# Patient Record
Sex: Female | Born: 1985 | Race: Black or African American | Hispanic: No | Marital: Married | Smoking: Never smoker
Health system: Southern US, Community
[De-identification: ages and names within clinical notes are randomized; demographics above are authoritative.]

## PROBLEM LIST (undated history)

## (undated) ENCOUNTER — Inpatient Hospital Stay (HOSPITAL_COMMUNITY): Payer: Self-pay

## (undated) DIAGNOSIS — Z8759 Personal history of other complications of pregnancy, childbirth and the puerperium: Secondary | ICD-10-CM

## (undated) DIAGNOSIS — Z8619 Personal history of other infectious and parasitic diseases: Secondary | ICD-10-CM

## (undated) DIAGNOSIS — E785 Hyperlipidemia, unspecified: Secondary | ICD-10-CM

## (undated) DIAGNOSIS — D649 Anemia, unspecified: Secondary | ICD-10-CM

## (undated) DIAGNOSIS — E7800 Pure hypercholesterolemia, unspecified: Secondary | ICD-10-CM

## (undated) DIAGNOSIS — Z5189 Encounter for other specified aftercare: Secondary | ICD-10-CM

## (undated) DIAGNOSIS — D219 Benign neoplasm of connective and other soft tissue, unspecified: Secondary | ICD-10-CM

## (undated) DIAGNOSIS — O4590 Premature separation of placenta, unspecified, unspecified trimester: Secondary | ICD-10-CM

## (undated) DIAGNOSIS — L309 Dermatitis, unspecified: Secondary | ICD-10-CM

## (undated) HISTORY — DX: Anemia, unspecified: D64.9

---

## 2011-11-04 ENCOUNTER — Ambulatory Visit (INDEPENDENT_AMBULATORY_CARE_PROVIDER_SITE_OTHER): Payer: Managed Care, Other (non HMO) | Admitting: Family Medicine

## 2011-11-04 ENCOUNTER — Ambulatory Visit (HOSPITAL_COMMUNITY)
Admission: RE | Admit: 2011-11-04 | Discharge: 2011-11-04 | Disposition: A | Discharge status: Home / Self Care | Payer: Managed Care, Other (non HMO) | Source: Ambulatory Visit | Attending: Family Medicine | Admitting: Family Medicine

## 2011-11-04 VITALS — BP 98/61 | HR 66 | Temp 98.1°F | Resp 16 | Ht 70.0 in | Wt 119.0 lb

## 2011-11-04 DIAGNOSIS — R109 Unspecified abdominal pain: Secondary | ICD-10-CM

## 2011-11-04 DIAGNOSIS — R110 Nausea: Secondary | ICD-10-CM | POA: Insufficient documentation

## 2011-11-04 DIAGNOSIS — R1031 Right lower quadrant pain: Secondary | ICD-10-CM | POA: Insufficient documentation

## 2011-11-04 DIAGNOSIS — R42 Dizziness and giddiness: Secondary | ICD-10-CM | POA: Insufficient documentation

## 2011-11-04 DIAGNOSIS — R1900 Intra-abdominal and pelvic swelling, mass and lump, unspecified site: Secondary | ICD-10-CM | POA: Insufficient documentation

## 2011-11-04 LAB — POCT CBC
Granulocyte percent: 57.8 %G (ref 37–80)
MID (cbc): 0.8 (ref 0–0.9)
MPV: 7.5 fL (ref 0–99.8)
POC Granulocyte: 5.1 (ref 2–6.9)
POC LYMPH PERCENT: 33.6 %L (ref 10–50)
Platelet Count, POC: 478 10*3/uL — AB (ref 142–424)
RBC: 4.95 M/uL (ref 4.04–5.48)
RDW, POC: 16 %

## 2011-11-04 NOTE — Progress Notes (Signed)
  Subjective:    Patient ID: April Lyons, female    DOB: 1985/12/28, 28 y.o.   MRN: [id number]  HPI 26 year old female with no significant past medical history coming in with a abdominal pain. Patient states this started approximately 2-3 days ago, gradual onset. Patient states that the pain is somewhat dispersed in her whole stomach area. Patient denies any relation to food. Patient states that she does not have much of an appetite. Patient denies any fevers but has had some subjective chills. Patient says that the most pain seems to be now in the right lower quadrant. Patient does have a history of urinary tract infections previously but this does not feel like that. Patient denies any dysuria or back pain. Patient denies any nausea vomiting diarrhea or constipation.   Review of Systems As stated above    Objective:   Physical Exam Filed Vitals:   11/04/11 2108  BP: 98/61  Pulse: 66  Temp: 98.1 F (36.7 C)  Resp: 16   General: No apparent distress alert and oriented x3 mood normal Cardiovascular: Regular in rhythm no murmur Pulmonary: Clear to auscultation bilaterally Abdominal exam: Bowel sounds positive patient is tender generalized but have significant tenderness in the right lower quadrant with involuntary guarding as well as rebound tenderness. Patient does have a positive McBurney sign. Patient has no suprapubic tenderness. Extremities: Patient is neurovascularly intact in 5 out of 5 strength in all extremity. Neurologic: Cranial nerves II through XII intact    Results for orders placed in visit on 11/04/11  POCT URINE PREGNANCY      Component Value Range   Preg Test, Ur Negative    POCT CBC      Component Value Range   WBC 8.8  4.6 - 10.2 K/uL   Lymph, poc 3.0  0.6 - 3.4   POC LYMPH PERCENT 33.6  10 - 50 %L   MID (cbc) 0.8  0 - 0.9   POC MID % 8.6  0 - 12 %M   POC Granulocyte 5.1  2 - 6.9   Granulocyte percent 57.8  37 - 80 %G   RBC 4.95  4.04 - 5.48 M/uL   Hemoglobin 12.8  12.2 - 16.2 g/dL   HCT, POC 40.9  81.1 - 47.9 %   MCV 83.6  80 - 97 fL   MCH, POC 25.9 (*) 27 - 31.2 pg   MCHC 30.9 (*) 31.8 - 35.4 g/dL   RDW, POC 91.4     Platelet Count, POC 478 (*) 142 - 424 K/uL   MPV 7.5  0 - 99.8 fL    Assessment & Plan:   1. Abdominal pain  POCT urine pregnancy, POCT CBC, CT Abdomen Pelvis W Contrast  2. RLQ abdominal pain

## 2011-11-04 NOTE — Assessment & Plan Note (Addendum)
Patient has abdominal pain that has now localized to the right lower quadrant, positive involuntary guarding as well as rebound tenderness. Patient is presenting for potential appendicitis. Urine pregnancy negative patient's white blood cell count is 8.8. Patient does have a little bit of an elevated platelet count that could be in response to inflammation. At this time we will get further imaging which includes a CT scan of the abdomen and pelvis. Patient will be going to [facility] for the skin. Patient knows that she will stay there until the skin is read. Patient understands that if she does have an appendicitis she will likely be having surgery tonight as well.

## 2011-11-05 ENCOUNTER — Other Ambulatory Visit: Payer: Self-pay | Admitting: Family Medicine

## 2011-11-05 ENCOUNTER — Ambulatory Visit (HOSPITAL_COMMUNITY)
Admission: RE | Admit: 2011-11-05 | Discharge: 2011-11-05 | Disposition: A | Discharge status: Home / Self Care | Payer: Managed Care, Other (non HMO) | Source: Ambulatory Visit | Attending: Family Medicine | Admitting: Family Medicine

## 2011-11-05 ENCOUNTER — Ambulatory Visit (INDEPENDENT_AMBULATORY_CARE_PROVIDER_SITE_OTHER): Payer: Managed Care, Other (non HMO) | Admitting: Family Medicine

## 2011-11-05 ENCOUNTER — Encounter (HOSPITAL_COMMUNITY): Payer: Self-pay

## 2011-11-05 VITALS — BP 108/58 | HR 95 | Temp 98.3°F | Resp 16 | Ht 70.0 in | Wt 118.0 lb

## 2011-11-05 DIAGNOSIS — R1031 Right lower quadrant pain: Secondary | ICD-10-CM

## 2011-11-05 DIAGNOSIS — N949 Unspecified condition associated with female genital organs and menstrual cycle: Secondary | ICD-10-CM | POA: Insufficient documentation

## 2011-11-05 DIAGNOSIS — K37 Unspecified appendicitis: Secondary | ICD-10-CM

## 2011-11-05 DIAGNOSIS — D259 Leiomyoma of uterus, unspecified: Secondary | ICD-10-CM

## 2011-11-05 LAB — POCT WET PREP WITH KOH
KOH Prep POC: NEGATIVE
Yeast Wet Prep HPF POC: NEGATIVE

## 2011-11-05 LAB — POCT CBC
HCT, POC: 38.3 % (ref 37.7–47.9)
Hemoglobin: 11.8 g/dL — AB (ref 12.2–16.2)
Lymph, poc: 1.6 (ref 0.6–3.4)
MCHC: 30.8 g/dL — AB (ref 31.8–35.4)
MCV: 83.8 fL (ref 80–97)
POC LYMPH PERCENT: 31.6 %L (ref 10–50)
RDW, POC: 15.6 %

## 2011-11-05 LAB — POCT URINALYSIS DIPSTICK
Nitrite, UA: NEGATIVE
Spec Grav, UA: >=1.03
Urobilinogen, UA: 2

## 2011-11-05 MED ORDER — IOHEXOL 300 MG/ML  SOLN
80.0000 mL | Freq: Once | INTRAMUSCULAR | Status: AC | PRN
  Administered 2011-11-05: 80 mL via INTRAVENOUS

## 2011-11-05 NOTE — Patient Instructions (Addendum)
1. Abdominal pain, RLQ  POCT CBC, POCT Wet Prep with KOH, POCT urinalysis dipstick, GC/chlamydia probe amp, genital, Ambulatory referral to Obstetrics / Gynecology  2. Uterine fibroid  Ambulatory referral to Obstetrics / Gynecology

## 2011-11-05 NOTE — Progress Notes (Signed)
Subjective:    Patient ID: April Lyons, female    DOB: [birth year], 26 y.o.   MRN: [id number]  HPI This 26 y.o. female presents for evaluation of abdominal pain RLQ.  24 hour follow-up for RLQ pain; no change from yesterday; possibly mildly improved.  Pain 4-5/10.    No fever/chills/sweats.  +dizziness persistent from yesterday.  No worsening pain.  Able to sleep for several hours this morning.  Ate a biscuit and cereal today.  No nausea but appetite still down.  Established with gyn while engaged in 12/2010; s/p pap smear normal.    Started OCP 05/2011. First sexual activity after marriage in 08/2011.   No vaginal discharge; no vaginal pain.  No dysuria, urgency, frequency, hematuria.  S/p CT abd/pelvis last night negative for acute appendicitis but + uterine mass.  S/p pelvic ultrasound +7.0 cm fibroid.  No longer having any LLQ pain.  Does have some pain with deep penetration during intercourse.  No history of STDs.  +diarrhea x 2 today; non-bloody.      Review of Systems  Constitutional: Negative for fever, chills and fatigue.  Gastrointestinal: Positive for abdominal pain and diarrhea. Negative for nausea, vomiting, constipation, blood in stool, abdominal distention and rectal pain.  Genitourinary: Positive for pelvic pain and dyspareunia. Negative for dysuria, urgency, frequency, hematuria, flank pain, vaginal bleeding, vaginal discharge, genital sores, vaginal pain and menstrual problem.  Skin: Negative for rash.    No past medical history on file.  No past surgical history on file.  Prior to Admission medications   Medication Sig Start Date End Date Taking? Authorizing Provider  norgestrel-ethinyl estradiol (LO/OVRAL,CRYSELLE) 0.3-30 MG-MCG tablet Take 1 tablet by mouth daily.    Historical Provider, MD    No Known Allergies  History   Social History  . Marital Status: Married    Spouse Name: N/A    Number of Children: N/A  . Years of Education: N/A   Occupational History    . Not on file.   Social History Main Topics  . Smoking status: Never Smoker   . Smokeless tobacco: Not on file  . Alcohol Use: Not on file  . Drug Use: Not on file  . Sexually Active: Not on file   Other Topics Concern  . Not on file   Social History Narrative  . No narrative on file    No family history on file.      Objective:   Physical Exam  Nursing note and vitals reviewed. Constitutional: She is oriented to person, place, and time. She appears well-developed and well-nourished. No distress.  Eyes: Conjunctivae are normal. Pupils are equal, round, and reactive to light.  Neck: Normal range of motion. Neck supple.  Cardiovascular: Normal rate, regular rhythm, normal heart sounds and intact distal pulses.   Pulmonary/Chest: Effort normal and breath sounds normal.  Abdominal: Soft. Bowel sounds are normal. She exhibits no distension and no mass. There is no hepatosplenomegaly. There is tenderness in the suprapubic area. There is guarding. There is no rigidity, no rebound and no CVA tenderness. No hernia.  Genitourinary: There is no rash, tenderness, lesion or injury on the right labia. There is no rash, tenderness, lesion or injury on the left labia. Uterus is enlarged and tender. Cervix exhibits no motion tenderness, no discharge and no friability. Right adnexum displays no mass, no tenderness and no fullness. Left adnexum displays no mass, no tenderness and no fullness. No erythema, tenderness or bleeding around the vagina. No foreign body around  the vagina. No signs of injury around the vagina.       No cervical motion tenderness; +TTP uterus which is enlarged at 8 cm.  Neurological: She is alert and oriented to person, place, and time.  Skin: Skin is warm and dry. She is not diaphoretic.  Psychiatric: She has a normal mood and affect. Her behavior is normal. Judgment and thought content normal.      Results for orders placed in visit on 11/05/11  POCT CBC      Component  Value Range   WBC 5.0  4.6 - 10.2 K/uL   Lymph, poc 1.6  0.6 - 3.4   POC LYMPH PERCENT 31.6  10 - 50 %L   MID (cbc) 0.4  0 - 0.9   POC MID % 8.4  0 - 12 %M   POC Granulocyte 3.0  2 - 6.9   Granulocyte percent 60.0  37 - 80 %G   RBC 4.57  4.04 - 5.48 M/uL   Hemoglobin 11.8 (*) 12.2 - 16.2 g/dL   HCT, POC 54.0  98.1 - 47.9 %   MCV 83.8  80 - 97 fL   MCH, POC 25.8 (*) 27 - 31.2 pg   MCHC 30.8 (*) 31.8 - 35.4 g/dL   RDW, POC 19.1     Platelet Count, POC 431 (*) 142 - 424 K/uL   MPV 7.3  0 - 99.8 fL  POCT WET PREP WITH KOH      Component Value Range   Trichomonas, UA Negative     Clue Cells Wet Prep HPF POC 0-2     Epithelial Wet Prep HPF POC 2-4     Yeast Wet Prep HPF POC neg     Bacteria Wet Prep HPF POC 1+     RBC Wet Prep HPF POC 0-3     WBC Wet Prep HPF POC 15-20     KOH Prep POC Negative    POCT URINALYSIS DIPSTICK      Component Value Range   Color, UA yellow     Clarity, UA clear     Glucose, UA neg     Bilirubin, UA small     Ketones, UA trace     Spec Grav, UA >=1.030     Blood, UA neg     pH, UA 5.5     Protein, UA neg     Urobilinogen, UA 2.0     Nitrite, UA neg     Leukocytes, UA Negative      Assessment & Plan:   1. Abdominal pain, RLQ  POCT CBC, POCT Wet Prep with KOH, POCT urinalysis dipstick, GC/chlamydia probe amp, genital, Ambulatory referral to Obstetrics / Gynecology  2. Uterine fibroid  Ambulatory referral to Obstetrics / Gynecology   3.  Pelvic pain  1.  RLQ Abdominal Pain:  Persistent with slight improvement; s/p CT abd/pelvis negative for acute appendicitis; s/p pelvic ultrasound +fibroid 7cm.  No fever; no vomiting; no worsening pain; continue to observe over next several days.  Recommend OTC Ibuprofen or Tylenol PRN.  RTC for acute worsening.  Obtain wet prep, GC/Chlam.  Diarrhea today likely secondary to contrast with CT last night. 2.  Uterine Fibroid:  New. Detected by CT abd/pelvis and pelvic ultrasound.  Likely etiology to current pain;  refer to gyn for further consultation.  3. Pelvic pain: persistent; secondary to uterine fibroid; obtain GC/Chlam to rule out other contributing etiologies.  Urine pregnancy negative yesterday.

## 2011-11-07 LAB — GC/CHLAMYDIA PROBE AMP, GENITAL
Chlamydia, DNA Probe: NEGATIVE
GC Probe Amp, Genital: NEGATIVE

## 2011-11-12 ENCOUNTER — Telehealth: Payer: Self-pay

## 2011-11-12 NOTE — Telephone Encounter (Signed)
PATIENT WAS SUPPOSED TO BE REFERRED TO AN OBGYN BY DR Katrinka Blazing BUT HAS NOT RECEIVED A CALL FROM HER. SHE IS NEW IN [place]OWN AND IS NOT FAMILIAR WITH ANY OBGYN'S AND WAS HOPING DR Katrinka Blazing COULD MAKE A RECOMMENDATION. SHE SAID ANY OTHER PROVIDER CAN SUGGEST SOMEONE AS WELL.  BEST # [phone]

## 2011-11-25 ENCOUNTER — Encounter: Payer: Managed Care, Other (non HMO) | Admitting: Advanced Practice Midwife

## 2011-12-02 NOTE — Progress Notes (Signed)
History and physical exam reviewed in detail.  Examined patient myself.  S:  Last menses less than one month ago.  No vaginal bleeding or discharge; married with no history of STDs.    O:  General: NAD  Abd:  BSNA; +TTP periumbilical and RLQ with guarding, rebound.  No masses or hernias.  A/P: RLQ pain: New.  Obtain CT abd/pelvis to evaluate for appendicitis.

## 2011-12-04 NOTE — Progress Notes (Signed)
Reviewed and agree.

## 2011-12-04 NOTE — Progress Notes (Signed)
Dr. Katrinka Blazing I was just reviewing the chart and was wondering if you received any feedback from the OB/GYN regarding their evaluation. Thanks

## 2011-12-07 ENCOUNTER — Encounter: Payer: Managed Care, Other (non HMO) | Admitting: Obstetrics & Gynecology

## 2011-12-09 ENCOUNTER — Encounter: Payer: Managed Care, Other (non HMO) | Admitting: Obstetrics & Gynecology

## 2011-12-14 ENCOUNTER — Ambulatory Visit (INDEPENDENT_AMBULATORY_CARE_PROVIDER_SITE_OTHER): Payer: Managed Care, Other (non HMO) | Admitting: Obstetrics & Gynecology

## 2011-12-14 ENCOUNTER — Encounter: Payer: Self-pay | Admitting: Obstetrics & Gynecology

## 2011-12-14 VITALS — BP 105/68 | HR 67 | Temp 97.9°F | Ht 70.0 in | Wt 119.0 lb

## 2011-12-14 DIAGNOSIS — D259 Leiomyoma of uterus, unspecified: Secondary | ICD-10-CM

## 2011-12-14 NOTE — Progress Notes (Signed)
History:  26 y.o. G0 here today for evaluation and management of large fibroid seen on imaging. Reports minimal pain, no bleeding. Was seen in ER for pain, takes NSAIDs occasionally.  The following portions of the patient's history were reviewed and updated as appropriate: allergies, current medications, past family history, past medical history, past social history, past surgical history and problem list.  Review of Systems:  Pertinent items are noted in HPI.  Objective:  Physical Exam Blood pressure 105/68, pulse 67, temperature 97.9 F (36.6 C), temperature source Oral, height 5\' 10"  (1.778 m), weight 119 lb (53.978 kg), last menstrual period 10/31/2011. Gen: NAD Abd: Soft, nontender and nondistended Pelvic: Normal appearing external genitalia; normal appearing vaginal mucosa and cervix.  Normal discharge.  Small uterus, large posterior fibroid palpated, no uterine or adnexal tenderness  Labs and Imaging 11/04/2011 CT ABDOMEN AND PELVIS WITH CONTRAST Findings: Lung bases clear. Liver, spleen, pancreas, kidneys, and adrenal glands normal appearance. Moderate free fluid in pelvis, low attenuation, with additional small amounts of free fluid identified adjacent to the spleen and at the paracolic gutters. Appendix is not well localized.  Questionable visualization of the appendix in the central mid to upper right pelvis on axial and coronal images. Large heterogeneous mass identified cranial to the uterus, 7.3 x 7.4 x 5.5 cm in size. This appears to be contiguous with the superior margin of uterus, question large exophytic uterine leiomyoma. There appear to be normal sized ovaries in the adnexae bilaterally, separate from this mass. Uterus and bladder otherwise normal morphology. Large and small bowel loops normal appearance. No adenopathy or free intraperitoneal air. No acute osseous findings. IMPRESSION: Moderate low attenuation free pelvic fluid with additional free fluid in the pericolic gutters  and adjacent to spleen. A normal appendix is only questionably visualized in the central pelvis; unable to completely exclude acute appendicitis in this setting. Large soft tissue mass 7.3 x 7.4 x 5.5 cm diameter identified cranial to the uterus, question exophytic leiomyoma though other masses are not completely excluded; recommend follow-up pelvic sonography to assess. Original Report Authenticated By: Lollie Marrow, M.D.  11/05/2011 TRANSABDOMINAL AND TRANSVAGINAL ULTRASOUND OF PELVIS Comparison: CT abdomen and pelvis 11/05/2011 Findings: Uterus: 9.0 cm length by 4.8 cm AP by 6.0 cm transverse. Small left lateral leiomyoma posteriorly 2.3 x 1.8 x 2.6 cm. Large exophytic mass at the superior margin of the posterior fundus, 6.9 x 4.6 x 6.3 cm. This mass appears contiguous with the uterus and demonstrates continuity of blood flow on color Doppler imaging, compatible with a large uterine exophytic leiomyoma. Blood flow is present within this exophytic leiomyoma on color Doppler imaging. Endometrium: 3.2 mm thick, normal. No endometrial fluid. Right ovary: 3.0 x 1.5 x 2.1 cm. Normal morphology without mass. Left ovary: 3.1 x 1.7 x 1.5 cm. Normal morphology without mass. Other findings: Moderate amount of simple free pelvic fluid. No additional adnexal masses.  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 This mass demonstrates internal blood flow on color Doppler imaging without evidence of torsion.  Normal appearing ovaries. Moderate simple appearing free pelvic fluid of uncertain etiology. Original Report Authenticated By: Lollie Marrow, M.D.  Assessment & Plan:  Discussed pain management with NSAIDs Discussed Lupron vs. UFE vs myomectomy (open vs robotic). Discussed risks, benefits of all modalities. Patient to consider her options and let us know how she wants to proceed. Pain precautions reviewed

## 2011-12-14 NOTE — Patient Instructions (Signed)
Uterine Fibroid A uterine fibroid is a growth (tumor) that occurs in a woman's uterus. This type of tumor is not cancerous and does not spread out of the uterus. A woman can have one or many fibroids, and the fiboid(s) can become quite large. A fibroid can vary in size, weight, and where it grows in the uterus. Most fibroids do not require medical treatment, but some can cause pain or heavy bleeding during and between periods. CAUSES  A fibroid is the result of a single uterine cell that keeps growing (unregulated), which is different than most cells in the human body. Most cells have a control mechanism that keeps them from reproducing without control.  SYMPTOMS   Bleeding.  Pelvic pain and pressure.  Bladder problems due to the size of the fibroid.  Infertility and miscarriages depending on the size and location of the fibroid. DIAGNOSIS  A diagnosis is made by physical exam. Your caregiver may feel the lumpy tumors during a pelvic exam. Important information regarding size, location, and number of tumors can be gained by having an ultrasound. It is rare that other tests, such as a CT scan or MRI, are needed. TREATMENT   Your caregiver may recommend watchful waiting. This involves getting the fibroid checked by your caregiver to see if the fibroids grow or shrink.   Hormonal treatment or an intrauterine device (IUD) may be prescribed.   Surgery may be needed to remove the fibroids (myomectomy) or the uterus (hysterectomy). This depends on your situation. When fibroids interfere with fertility and a woman wants to become pregnant, a caregiver may recommend having the fibroids removed.  HOME CARE INSTRUCTIONS  Home care depends on how you were treated. In general:   Keep all follow-up appointments with your caregiver.   Only take medicine as told by your caregiver. Do not take aspirin. It can cause bleeding.   If you have excessive periods and soak tampons or pads in a half hour or  less, contact your caregiver immediately. If your periods are troublesome but not so heavy, lie down with your feet raised slightly above your heart. Place cold packs on your lower abdomen.   If your periods are heavy, write down the number of pads or tampons you use per month. Bring this information to your caregiver.   Talk to your caregiver about taking iron pills.   Include green vegetables in your diet.   If you were prescribed a hormonal treatment, take the hormonal medicines as directed.   If you need surgery, ask your caregiver for information on your specific surgery.  SEEK IMMEDIATE MEDICAL CARE IF:  You have pelvic pain or cramps not controlled with medicines.   You have a sudden increase in pelvic pain.   You have an increase of bleeding between and during periods.   You feel lightheaded or have fainting episodes.  MAKE SURE YOU:  Understand these instructions.  Will watch your condition.  Will get help right away if you are not doing well or get worse. Document Released: 02/14/2000 Document Revised: 05/11/2011 Document Reviewed: 03/09/2011 [facility] Patient Information 2013 [place], [place].  Myomectomy Myoma is a non-cancerous tumor made up of fibrous tissue. It is also called leiomyoma, but more often called a fibroid tumor. Myomectomy is the removal of a fibroid tumor without removing another organ, like the uterus or ovary, with it. Fibroids range from the size of a pea to a grapefruit. They are rarely cancerous. Myomas only need treatment when they are growing  or when they cause symptoms, such aspain, pressure, bleeding, and pain with intercourse. LET YOUR CAREGIVER KNOW ABOUT:  Any allergies, especially to medicines.  If you develop a cold or an infection before your surgery.  Medicines taken, including vitamins, herbs, eyedrops, over-ther-counter medicines, and creams.  Use of steroids (by mouth or creams).  Previous problems with numbing  medicines.  History of blood clots or other bleeding problems.  Other health problems, such as diabetes, kidney, heart, or lung problems.  Previous surgery.  Possibility of pregnancy, if this applies. RISKS AND COMPLICATIONS   Excessive bleeding.  Infection.  Injury to other organs.  Blood clots in the legs, chest, and brain.  Scar tissue (adhesions) on other organs and in the pelvis.  Death during or after the surgery. BEFORE THE PROCEDURE  Follow your caregiver's advice regarding your surgery and preparing for surgery.  Avoid taking aspirin or blood thinners as directed by your caregiver.  DO NOT eat or drink anything after midnight on the night before surgery, or as directed by your caregiver.  DO NOT smoke (if you smoke) for 2 weeks before the surgery.  DO NOT drink alcohol the day before the surgery.  If you are admitted the day of the surgery,arrive1 hour before your surgery is scheduled.  Arrange to have someone take you home from the hospital.  Arrange to have someone care for you when you go home. PROCEDURE There are several ways to perform a myomectomy:  Hysteroscopy myomectomy. A lighted tube is inserted inside the uterus. The tube will remove the fibroid. This is used when the fibroid is inside the cavity of the uterus.  Laparoscopic myomectomy. A long, lighted tube is inserted through 2 or 3 small incisions to see the organs in the pelvis. The fibroid is removed.  Myomectomy through a sugical cut (incicion) in the abdomen. The fibroid is removed through an incision made in the stomach. This way is performed when thethe fibroid cannot be removed with a hysteroscope or laprascope. AFTER THE PROCEDURE  If you had laparoscopic or hysteroscopic myomectomy, you may go home the same day or stay overnight.  If you had abdominal myomectomy, you may stay in the hospital a few days.  Your intravenous (IV)access tube and catheter will be removed in 1 or 2  days.  If you stay in the hospital, your caregiver will order pain medicine and a sleeping pill, if needed.  You may be placed on an antibiotic medicine, if needed.  You may be given written instructions and medicines before you are sent home. Document Released: 12/14/2006 Document Revised: 05/11/2011 Document Reviewed: 12/26/2008 [facility] Patient Information 2013 [place], [place].  Uterine Artery Embolization for Fibroids Uterine fibroids are non-cancerous (benign) smooth muscle tumors of the uterus. When they become large, they may produce symptoms of pain and bleeding. Fibroids are sometimes individually removed during surgery or removed with the uterus (hysterectomy).  One non-surgical treatment used to shrink fibroids is called uterine artery embolization. A specialist (interventional radiologist) uses a thin plastic hose(catheter) to inject material that blocks off the blood supply to the fibroid. In time, this causes the fibroid to shrink. PROCEDURE  Under local anesthetic (a medication that numbs part of the body) the radiologist makes a small cut in the groin. A catheter is then inserted into the main artery of the leg. Using fluoroscopy, your radiologist guides the catheter through the artery to the uterus. A series of images are taken while dye is injected. This is done to  provide a road map of the blood supply to the uterus and fibroids. Tiny plastic spheres about the size of sand grains are then injected through the catheter. Metal coils may sometimes also be used to help block the artery. The particles lodge in tiny branches of the uterine artery that supplies blood to the fibroids. The procedure is repeated on the artery that supplies the other side of the uterus. The hospital stay is usually overnight. Normal activity can resume after about a week. Mild pain and cramping following the procedure is easily treated with medication and anti-inflammatory drugs. These usually last only a  couple days.  RISKS AND COMPLICATIONS  Injury to the uterus from decreased blood supply may happen.  This could require removal of the uterus (hysterectomy).  Pain and bleeding can occur.  Infection and abscess (a cyst filled with pus).  A cyst filled with blood (hematoma).  Blood infection (septicemia).  Amenorrhea (no menstrual period).  Dying of tissue cells that cannot recover (necrosis) to the bladder or lips of the vagina.  Fistula (a connection between organs or from organ to the skin).  Blood clot in the lung (pulmonary embolus).  Rarely death. EXPECTED OUTCOME An ultrasound or MRI is done in 6 months to make sure the fibroids have shrunk. The fibroids usually shrink to about half their original size. In most cases these effects are long lasting.   The uterus also shrinks but does not die. You may not be able to get pregnant following this procedure.  It cannot be estimated what the effects of the procedure will be on menses. Usually there is less bleeding.  The procedure may cause premature menopause or loss of menstrual cycle. HOME CARE INSTRUCTIONS   Follow your caregiver's advice regarding medications given to you, diet, activity and when to begin sexual activity.  See your caregiver for follow up care as directed.  Do not take aspirin it can cause bleeding. Only take over-the-counter or prescription medicines for pain, discomfort, or fever as directed by your caregiver.  Care for and change dressing as directed. SEEK MEDICAL CARE IF:   You develop a temperature of 102 F (38.9 C) or higher.  There is redness, swelling and pain around the wound.  You have pus draining from the wound.  You develop a rash. SEEK IMMEDIATE MEDICAL CARE IF:   You have bleeding from the wound.  You have difficulty breathing.  You develop chest pain.  You develop belly (abdominal) pain.  You develop leg pain.  You become dizzy and pass out. Document Released:  05/04/2005 Document Revised: 05/11/2011 Document Reviewed: 03/31/2007 [facility] Patient Information 2013 [place], [place].

## 2013-04-06 LAB — OB RESULTS CONSOLE HIV ANTIBODY (ROUTINE TESTING): HIV: NONREACTIVE

## 2013-04-06 LAB — OB RESULTS CONSOLE GC/CHLAMYDIA
Chlamydia: NEGATIVE
Gonorrhea: NEGATIVE

## 2013-04-06 LAB — OB RESULTS CONSOLE RPR: RPR: NONREACTIVE

## 2013-04-06 LAB — OB RESULTS CONSOLE HEPATITIS B SURFACE ANTIGEN: HEP B S AG: NEGATIVE

## 2013-04-06 LAB — OB RESULTS CONSOLE ANTIBODY SCREEN: Antibody Screen: NEGATIVE

## 2013-04-06 LAB — OB RESULTS CONSOLE ABO/RH: RH TYPE: POSITIVE

## 2013-04-06 LAB — OB RESULTS CONSOLE RUBELLA ANTIBODY, IGM: Rubella: IMMUNE

## 2013-08-28 ENCOUNTER — Observation Stay (HOSPITAL_COMMUNITY): Payer: Managed Care, Other (non HMO)

## 2013-08-28 ENCOUNTER — Observation Stay (HOSPITAL_COMMUNITY)
Admission: AD | Admit: 2013-08-28 | Discharge: 2013-08-30 | Disposition: A | Discharge status: Home / Self Care | Payer: Managed Care, Other (non HMO) | Source: Ambulatory Visit | Attending: Obstetrics and Gynecology | Admitting: Obstetrics and Gynecology

## 2013-08-28 ENCOUNTER — Encounter (HOSPITAL_COMMUNITY): Payer: Self-pay | Admitting: *Deleted

## 2013-08-28 DIAGNOSIS — O4700 False labor before 37 completed weeks of gestation, unspecified trimester: Principal | ICD-10-CM | POA: Diagnosis present

## 2013-08-28 HISTORY — DX: Pure hypercholesterolemia, unspecified: E78.00

## 2013-08-28 LAB — URINALYSIS, ROUTINE W REFLEX MICROSCOPIC
Bilirubin Urine: NEGATIVE
GLUCOSE, UA: NEGATIVE mg/dL
HGB URINE DIPSTICK: NEGATIVE
Ketones, ur: NEGATIVE mg/dL
Nitrite: NEGATIVE
PH: 8 (ref 5.0–8.0)
PROTEIN: NEGATIVE mg/dL
Specific Gravity, Urine: 1.02 (ref 1.005–1.030)
Urobilinogen, UA: 1 mg/dL (ref 0.0–1.0)

## 2013-08-28 LAB — COMPREHENSIVE METABOLIC PANEL
ALK PHOS: 96 U/L (ref 39–117)
ALT: 11 U/L (ref 0–35)
AST: 16 U/L (ref 0–37)
Albumin: 2.6 g/dL — ABNORMAL LOW (ref 3.5–5.2)
BILIRUBIN TOTAL: 0.2 mg/dL — AB (ref 0.3–1.2)
BUN: 6 mg/dL (ref 6–23)
CHLORIDE: 97 meq/L (ref 96–112)
CO2: 25 meq/L (ref 19–32)
Calcium: 8.9 mg/dL (ref 8.4–10.5)
Creatinine, Ser: 0.5 mg/dL (ref 0.50–1.10)
GFR calc non Af Amer: >90 mL/min (ref >90)
GLUCOSE: 95 mg/dL (ref 70–99)
POTASSIUM: 4.6 meq/L (ref 3.7–5.3)
SODIUM: 132 meq/L — AB (ref 137–147)
TOTAL PROTEIN: 6.8 g/dL (ref 6.0–8.3)

## 2013-08-28 LAB — URINE MICROSCOPIC-ADD ON

## 2013-08-28 MED ORDER — ACETAMINOPHEN 325 MG PO TABS: 650.0000 mg | ORAL_TABLET | ORAL | Status: DC | PRN

## 2013-08-28 MED ORDER — NIFEDIPINE 10 MG PO CAPS
10.0000 mg | ORAL_CAPSULE | Freq: Four times a day (QID) | ORAL | Status: DC
  Administered 2013-08-29 – 2013-08-30 (×7): 10 mg via ORAL
  Filled 2013-08-28 (×7): qty 1

## 2013-08-28 MED ORDER — PRENATAL MULTIVITAMIN CH
1.0000 | ORAL_TABLET | Freq: Every day | ORAL | Status: DC
  Administered 2013-08-29 – 2013-08-30 (×2): 1 via ORAL
  Filled 2013-08-28 (×2): qty 1

## 2013-08-28 MED ORDER — ZOLPIDEM TARTRATE 5 MG PO TABS: 5.0000 mg | ORAL_TABLET | Freq: Every evening | ORAL | Status: DC | PRN

## 2013-08-28 MED ORDER — BETAMETHASONE SOD PHOS & ACET 6 (3-3) MG/ML IJ SUSP
12.0000 mg | INTRAMUSCULAR | Status: AC
  Administered 2013-08-28 – 2013-08-29 (×2): 12 mg via INTRAMUSCULAR
  Filled 2013-08-28 (×2): qty 2

## 2013-08-28 MED ORDER — DOCUSATE SODIUM 100 MG PO CAPS
100.0000 mg | ORAL_CAPSULE | Freq: Every day | ORAL | Status: DC
  Filled 2013-08-28 (×2): qty 1

## 2013-08-28 MED ORDER — ONDANSETRON 8 MG PO TBDP
8.0000 mg | ORAL_TABLET | Freq: Once | ORAL | Status: AC
  Administered 2013-08-28: 8 mg via ORAL
  Filled 2013-08-28: qty 1

## 2013-08-28 MED ORDER — NIFEDIPINE 10 MG PO CAPS
20.0000 mg | ORAL_CAPSULE | Freq: Once | ORAL | Status: AC
  Administered 2013-08-28: 20 mg via ORAL
  Filled 2013-08-28: qty 2

## 2013-08-28 MED ORDER — CALCIUM CARBONATE ANTACID 500 MG PO CHEW: 2.0000 | CHEWABLE_TABLET | ORAL | Status: DC | PRN

## 2013-08-28 NOTE — Progress Notes (Signed)
Patient has been resting on her side. States she has not felt any "spikes" for awhile and was able to take a nap.

## 2013-08-28 NOTE — H&P (Signed)
April Lyons is a 28 y.o. female presenting for abdominal pain and contractions  [name] G1P0 @ 29+2 presents for abdominal pain and painful contractions. Pt states abdominal pain started around 1:30. She denies nausea, vomiting, fever, chills, vaginal bleeding, or leaking of fluid.  Pain became worse and was 10/10 on admission. She started vomiting on admission. Pain gradually improved but then the baby had a decel to the 50s for 5 minutes. The decision was made to admit the patient for observation and continuous monitoring History OB History   Grav Para Term Preterm Abortions TAB SAB Ect Mult Living   1              Past Medical History  Diagnosis Date  . Anemia   . Hypercholesteremia     Fibroids   History reviewed. No pertinent past surgical history. Family History: family history includes Alcohol abuse in her paternal grandfather; Asthma in her brother; Cancer in her maternal grandfather and maternal grandmother; Hypertension in her mother. Social History:  reports that she has never smoked. She has never used smokeless tobacco. She reports that she does not drink alcohol or use illicit drugs.   Prenatal Transfer Tool  Maternal Diabetes: No Genetic Screening: Declined Maternal Ultrasounds/Referrals: Normal Fetal Ultrasounds or other Referrals:  None Maternal Substance Abuse:  No Significant Maternal Medications:  None Significant Maternal Lab Results:  None Other Comments:  None  ROS: as above  Dilation: Fingertip Effacement (%): Thick Exam by:: April Midget, PA Blood pressure 117/63, pulse 75, temperature 98.6 F (37 C), temperature source Oral, resp. rate 18, height 5\' 10"  (1.778 m), weight 61.689 kg (136 lb). Exam Physical Exam  Prenatal labs: ABO, Rh:  A positive Antibody:  Negative Rubella:   Immune RPR:   NR HBsAg:   Neg HIV:   NR GBS:   Not done  Assessment/Plan: 1) Admit to antenatal for 23 hr obs 2) Continuous monitoring 3) Comprehensive [place] to evaluate  for occult abruption 4) BMZ to enhance FLM 5) Unable to do FFN d/t recent intercourse  Lyons,April H. 08/28/2013, 11:09 PM

## 2013-08-28 NOTE — MAU Provider Note (Signed)
History     CSN: [id number]  Arrival date and time: 08/28/13 1615   First Provider Initiated Contact with Patient 08/28/13 1630      No chief complaint on file.  HPI Ms. April Lyons is a 28 y.o. No obstetric history on file. At Unknown who presents to MAU today with complaint of contractions q 3 minutes since 1420 today. The patient denies vaginal bleeding or LOF today. She states that she noted a yellow mucus discharge. She denies complications with the pregnancy. She reports good fetal movement. Patient denies N/V prior to arrival, but is actively vomiting in MAU just after arrival.   OB History   Grav Para Term Preterm Abortions TAB SAB Ect Mult Living   1               Past Medical History  Diagnosis Date  . Anemia   . Hypercholesteremia     History reviewed. No pertinent past surgical history.  Family History  Problem Relation Age of Onset  . Hypertension Mother   . Asthma Brother   . Cancer Maternal Grandmother     unknown  . Cancer Maternal Grandfather     lung cancer  . Alcohol abuse Paternal Grandfather     History  Substance Use Topics  . Smoking status: Never Smoker   . Smokeless tobacco: Never Used  . Alcohol Use: No    Allergies: No Known Allergies  Prescriptions prior to admission  Medication Sig Dispense Refill  . Prenatal Vit-Fe Fumarate-FA (PRENATAL MULTIVITAMIN) TABS tablet Take 1 tablet by mouth daily at 12 noon.        Review of Systems  Constitutional: Negative for fever.  Gastrointestinal: Positive for abdominal pain.  Genitourinary:       + vaginal discharge Neg - vaginal bleeding, LOF   Physical Exam   Blood pressure 117/66, pulse 81, temperature 97.8 F (36.6 C), temperature source Oral, resp. rate 18.  Physical Exam  Constitutional: She is oriented to person, place, and time. She appears well-developed and well-nourished. No distress.  HENT:  Head: Normocephalic and atraumatic.  Cardiovascular: Normal rate.    Respiratory: Effort normal.  Neurological: She is alert and oriented to person, place, and time.  Skin: Skin is warm and dry. No erythema.  Psychiatric: She has a normal mood and affect.  Dilation: Fingertip Effacement (%): Thick Cervical Position: Posterior Exam by:: Bethena Midget, PA  Results for orders placed during the hospital encounter of 08/28/13 (from the past 24 hour(s))  URINALYSIS, ROUTINE W REFLEX MICROSCOPIC     Status: Abnormal   Collection Time    08/28/13  5:17 PM      Result Value Ref Range   Color, Urine YELLOW  YELLOW   APPearance HAZY (*) CLEAR   Specific Gravity, Urine 1.020  1.005 - 1.030   pH 8.0  5.0 - 8.0   Glucose, UA NEGATIVE  NEGATIVE mg/dL   Hgb urine dipstick NEGATIVE  NEGATIVE   Bilirubin Urine NEGATIVE  NEGATIVE   Ketones, ur NEGATIVE  NEGATIVE mg/dL   Protein, ur NEGATIVE  NEGATIVE mg/dL   Urobilinogen, UA 1.0  0.0 - 1.0 mg/dL   Nitrite NEGATIVE  NEGATIVE   Leukocytes, UA SMALL (*) NEGATIVE  URINE MICROSCOPIC-ADD ON     Status: Abnormal   Collection Time    08/28/13  5:17 PM      Result Value Ref Range   Squamous Epithelial / LPF FEW (*) RARE   WBC, UA 11-20  <3  WBC/hpf   RBC / HPF 3-6  <3 RBC/hpf   Bacteria, UA MANY (*) RARE   Urine-Other AMORPHOUS URATES/PHOSPHATES      Fetal Monitoring:  Baseline: 120 bpm, moderate variability, + accelerations, no decelerations Contractions: Irregular with moderate UI  MAU Course  Procedures None  MDM UA today Urine culture ordered Discussed with Dr. Harrington Challenger. 20 mg Procardia in MAU and then admit to Antenatal for observation  Assessment and Plan  A: [facility] at [redacted]w[redacted]d Preterm contractions  P: Urine culture pending Admit to Antenatal for observation   Farris Has, PA-C  08/28/2013, 7:00 PM

## 2013-08-28 NOTE — MAU Note (Addendum)
Pain since 1440, states is constant across lower abdomen. Then states that pain comes and goes, but does not completely go away. Sometimes "little spikes" other times worse pain. Thinks it is contractions. No bleeding or leaking.

## 2013-08-29 LAB — CBC
HEMATOCRIT: 34.6 % — AB (ref 36.0–46.0)
HEMOGLOBIN: 11.4 g/dL — AB (ref 12.0–15.0)
MCH: 28.4 pg (ref 26.0–34.0)
MCHC: 32.9 g/dL (ref 30.0–36.0)
MCV: 86.1 fL (ref 78.0–100.0)
Platelets: 285 10*3/uL (ref 150–400)
RBC: 4.02 MIL/uL (ref 3.87–5.11)
RDW: 13.6 % (ref 11.5–15.5)
WBC: 11.1 10*3/uL — AB (ref 4.0–10.5)

## 2013-08-29 NOTE — Progress Notes (Signed)
Called by RN to discuss discharge home.  Pt receiving 2nd dose of betamethasone now.  I reviewed available FH tracing for the day and found that pt had an additional 4-5 minute decel to approx 40 at approx 6:15pm.  Advised RN that we would like to keep her an additional night and consider an MFM consultation prior to discharge.  FHT are otherwise very reassuring with moderate variability and + acclerations.

## 2013-08-29 NOTE — Progress Notes (Addendum)
HD#2 admission for 5 minute deceleration in MAU while being evaluated for abdominal pain Pt states pain resolved around midnight and feels good now.  Good FM, no LOF or bleeding. FHT 135 reactive, strip reviewed from overnight and no additional decelerations noted: Cat 1 TOCO: occ irritability SVE: deferred  A/P: FHT deceleration Currently cat 1 [place] 6/29: marginal/low lying placenta, will repeat 28 weeks, no bleeding, no evidence of subchor hem or abruption Received one dose of betamethasone at 9pm, will continue to monitor continously today and get 2nd dose of beta this evening.  If no further deceleration and no new complaint will plan to d/c home.

## 2013-08-30 ENCOUNTER — Observation Stay (HOSPITAL_COMMUNITY): Payer: Managed Care, Other (non HMO)

## 2013-08-30 LAB — CBC
HCT: 33.4 % — ABNORMAL LOW (ref 36.0–46.0)
Hemoglobin: 11.2 g/dL — ABNORMAL LOW (ref 12.0–15.0)
MCH: 28.9 pg (ref 26.0–34.0)
MCHC: 33.5 g/dL (ref 30.0–36.0)
MCV: 86.3 fL (ref 78.0–100.0)
PLATELETS: 263 10*3/uL (ref 150–400)
RBC: 3.87 MIL/uL (ref 3.87–5.11)
RDW: 13.6 % (ref 11.5–15.5)
WBC: 13.2 10*3/uL — AB (ref 4.0–10.5)

## 2013-08-30 MED ORDER — NIFEDIPINE 10 MG PO CAPS: 10.0000 mg | ORAL_CAPSULE | Freq: Four times a day (QID) | ORAL | Status: DC

## 2013-08-30 NOTE — Progress Notes (Signed)
Ur chart review completed.  

## 2013-08-30 NOTE — Discharge Instructions (Signed)
Preterm Labor Information Preterm labor is when labor starts at less than 37 weeks of pregnancy. The normal length of a pregnancy is 39 to 41 weeks. CAUSES Often, there is no identifiable underlying cause as to why a woman goes into preterm labor. One of the most common known causes of preterm labor is infection. Infections of the uterus, cervix, vagina, amniotic sac, bladder, kidney, or even the lungs (pneumonia) can cause labor to start. Other suspected causes of preterm labor include:   Urogenital infections, such as yeast infections and bacterial vaginosis.   Uterine abnormalities (uterine shape, uterine septum, fibroids, or bleeding from the placenta).   A cervix that has been operated on (it may fail to stay closed).   Malformations in the fetus.   Multiple gestations (twins, triplets, and so on).   Breakage of the amniotic sac.  RISK FACTORS  Having a previous history of preterm labor.   Having premature rupture of membranes (PROM).   Having a placenta that covers the opening of the cervix (placenta previa).   Having a placenta that separates from the uterus (placental abruption).   Having a cervix that is too weak to hold the fetus in the uterus (incompetent cervix).   Having too much fluid in the amniotic sac (polyhydramnios).   Taking illegal drugs or smoking while pregnant.   Not gaining enough weight while pregnant.   Being younger than 48 and older than 30 years old.   Having a low socioeconomic status.   Being African American. SYMPTOMS Signs and symptoms of preterm labor include:   Menstrual-like cramps, abdominal pain, or back pain.  Uterine contractions that are regular, as frequent as six in an hour, regardless of their intensity (may be mild or painful).  Contractions that start on the top of the uterus and spread down to the lower abdomen and back.   A sense of increased pelvic pressure.   A watery or bloody mucus discharge that  comes from the vagina.  TREATMENT Depending on the length of the pregnancy and other circumstances, your health care provider may suggest bed rest. If necessary, there are medicines that can be given to stop contractions and to mature the fetal lungs. If labor happens before 34 weeks of pregnancy, a prolonged hospital stay may be recommended. Treatment depends on the condition of both you and the fetus.  WHAT SHOULD YOU DO IF YOU THINK YOU ARE IN PRETERM LABOR? Call your health care provider right away. You will need to go to the hospital to get checked immediately. HOW CAN YOU PREVENT PRETERM LABOR IN FUTURE PREGNANCIES? You should:   Stop smoking if you smoke.  Maintain healthy weight gain and avoid chemicals and drugs that are not necessary.  Be watchful for any type of infection.  Inform your health care provider if you have a known history of preterm labor. Document Released: 05/09/2003 Document Revised: 10/19/2012 Document Reviewed: 03/21/2012 [facility] Patient Information 2015 [place], [place]. This information is not intended to replace advice given to you by your health care provider. Make sure you discuss any questions you have with your health care provider.   Pelvic Rest Pelvic rest is sometimes recommended for women when:   The placenta is partially or completely covering the opening of the cervix (placenta previa).  There is bleeding between the uterine wall and the amniotic sac in the first trimester (subchorionic hemorrhage).  The cervix begins to open without labor starting (incompetent cervix, cervical insufficiency).  The labor is too early (  preterm labor). HOME CARE INSTRUCTIONS  Do not have sexual intercourse, stimulation, or an orgasm.  Do not use tampons, douche, or put anything in the vagina.  Do not lift anything over 10 pounds (4.5 kg).  Avoid strenuous activity or straining your pelvic muscles. SEEK MEDICAL CARE IF:  You have any vaginal bleeding  during pregnancy. Treat this as a potential emergency.  You have cramping pain felt low in the stomach (stronger than menstrual cramps).  You notice vaginal discharge (watery, mucus, or bloody).  You have a low, dull backache.  There are regular contractions or uterine tightening. SEEK IMMEDIATE MEDICAL CARE IF: You have vaginal bleeding and have placenta previa.  Document Released: 06/13/2010 Document Revised: 05/11/2011 Document Reviewed: 06/13/2010 [facility] Patient Information 2015 [place], [place]. This information is not intended to replace advice given to you by your health care provider. Make sure you discuss any questions you have with your health care provider.  Fetal Movement Counts  Patient Name: __________________________________________________ Patient Due Date: ____________________ Performing a fetal movement count is highly recommended in high-risk pregnancies, but it is good for every pregnant woman to do. Your caregiver may ask you to start counting fetal movements at 28 weeks of the pregnancy. Fetal movements often increase:  After eating a full meal.  After physical activity.  After eating or drinking something sweet or cold.  At rest. Pay attention to when you feel the baby is most active. This will help you notice a pattern of your baby's sleep and wake cycles and what factors contribute to an increase in fetal movement. It is important to perform a fetal movement count at the same time each day when your baby is normally most active.  HOW TO COUNT FETAL MOVEMENTS 1. Find a quiet and comfortable area to sit or lie down on your left side. Lying on your left side provides the best blood and oxygen circulation to your baby. 2. Write down the day and time on a sheet of paper or in a journal. 3. Start counting kicks, flutters, swishes, rolls, or jabs in a 2 hour period. You should feel at least 10 movements within 2 hours. 4. If you do not feel 10 movements in 2 hours,  wait 2-3 hours and count again. Look for a change in the pattern or not enough counts in 2 hours. SEEK MEDICAL CARE IF:  You feel less than 10 counts in 2 hours, tried twice.  There is no movement in over an hour.  The pattern is changing or taking longer each day to reach 10 counts in 2 hours.  You feel the baby is not moving as he or she usually does. Date: ____________ Movements: ____________ Start time: ____________ April Lyons time: ____________  Date: ____________ Movements: ____________ Start time: ____________ April Lyons time: ____________ Date: ____________ Movements: ____________ Start time: ____________ April Lyons time: ____________ Date: ____________ Movements: ____________ Start time: ____________ April Lyons time: ____________ Date: ____________ Movements: ____________ Start time: ____________ April Lyons time: ____________ Date: ____________ Movements: ____________ Start time: ____________ April Lyons time: ____________ Date: ____________ Movements: ____________ Start time: ____________ April Lyons time: ____________ Date: ____________ Movements: ____________ Start time: ____________ April Lyons time: ____________  Date: ____________ Movements: ____________ Start time: ____________ April Lyons time: ____________ Date: ____________ Movements: ____________ Start time: ____________ April Lyons time: ____________ Date: ____________ Movements: ____________ Start time: ____________ April Lyons time: ____________ Date: ____________ Movements: ____________ Start time: ____________ April Lyons time: ____________ Date: ____________ Movements: ____________ Start time: ____________ April Lyons time: ____________ Date: ____________ Movements: ____________ Start time: ____________ April Lyons time: ____________  Date: ____________ Movements: ____________ Start time: ____________ April Lyons time: ____________  Date: ____________ Movements: ____________ Start time: ____________ April Lyons time: ____________ Date: ____________ Movements: ____________ Start time:  ____________ April Lyons time: ____________ Date: ____________ Movements: ____________ Start time: ____________ April Lyons time: ____________ Date: ____________ Movements: ____________ Start time: ____________ April Lyons time: ____________ Date: ____________ Movements: ____________ Start time: ____________ April Lyons time: ____________ Date: ____________ Movements: ____________ Start time: ____________ April Lyons time: ____________ Date: ____________ Movements: ____________ Start time: ____________ April Lyons time: ____________  Date: ____________ Movements: ____________ Start time: ____________ April Lyons time: ____________ Date: ____________ Movements: ____________ Start time: ____________ April Lyons time: ____________ Date: ____________ Movements: ____________ Start time: ____________ April Lyons time: ____________ Date: ____________ Movements: ____________ Start time: ____________ April Lyons time: ____________ Date: ____________ Movements: ____________ Start time: ____________ April Lyons time: ____________ Date: ____________ Movements: ____________ Start time: ____________ April Lyons time: ____________ Date: ____________ Movements: ____________ Start time: ____________ April Lyons time: ____________  Date: ____________ Movements: ____________ Start time: ____________ April Lyons time: ____________ Date: ____________ Movements: ____________ Start time: ____________ April Lyons time: ____________ Date: ____________ Movements: ____________ Start time: ____________ April Lyons time: ____________ Date: ____________ Movements: ____________ Start time: ____________ April Lyons time: ____________ Date: ____________ Movements: ____________ Start time: ____________ April Lyons time: ____________ Date: ____________ Movements: ____________ Start time: ____________ April Lyons time: ____________ Date: ____________ Movements: ____________ Start time: ____________ April Lyons time: ____________  Date: ____________ Movements: ____________ Start time: ____________ April Lyons time: ____________ Date:  ____________ Movements: ____________ Start time: ____________ April Lyons time: ____________ Date: ____________ Movements: ____________ Start time: ____________ April Lyons time: ____________ Date: ____________ Movements: ____________ Start time: ____________ April Lyons time: ____________ Date: ____________ Movements: ____________ Start time: ____________ April Lyons time: ____________ Date: ____________ Movements: ____________ Start time: ____________ April Lyons time: ____________ Date: ____________ Movements: ____________ Start time: ____________ April Lyons time: ____________  Date: ____________ Movements: ____________ Start time: ____________ April Lyons time: ____________ Date: ____________ Movements: ____________ Start time: ____________ April Lyons time: ____________ Date: ____________ Movements: ____________ Start time: ____________ April Lyons time: ____________ Date: ____________ Movements: ____________ Start time: ____________ April Lyons time: ____________ Date: ____________ Movements: ____________ Start time: ____________ April Lyons time: ____________ Date: ____________ Movements: ____________ Start time: ____________ April Lyons time: ____________ Date: ____________ Movements: ____________ Start time: ____________ April Lyons time: ____________  Date: ____________ Movements: ____________ Start time: ____________ April Lyons time: ____________ Date: ____________ Movements: ____________ Start time: ____________ April Lyons time: ____________ Date: ____________ Movements: ____________ Start time: ____________ April Lyons time: ____________ Date: ____________ Movements: ____________ Start time: ____________ April Lyons time: ____________ Date: ____________ Movements: ____________ Start time: ____________ April Lyons time: ____________ Date: ____________ Movements: ____________ Start time: ____________ April Lyons time: ____________ Document Released: 03/18/2006 Document Revised: 02/03/2012 Document Reviewed: 12/14/2011 [employer] Patient Information 2015 [employer]. This  information is not intended to replace advice given to you by your health care provider. Make sure you discuss any questions you have with your health care provider.

## 2013-08-30 NOTE — Progress Notes (Signed)
Pt contractions have stopped. She has not had any more decels since admission. Pt seen by MFM. Rec BPP and then discharge. Will send home on Procardia and follow up in one week.

## 2013-08-30 NOTE — Progress Notes (Signed)
Dr. Ouida Sills called with orders for Maternal Fetal Medicine consult due to patient having a fetal heart rate deceleration at 1815 last night per Dr. Rogue Bussing.  Strip reviewed and the fetal heart rate deceleration that was mentioned by Dr. Rogue Bussing occurred in [place] Admission on 08/28/13 at 1815, not on 08/29/13 at 1815. Dr. Ouida Sills made aware of the above.

## 2013-08-30 NOTE — Consult Note (Signed)
Maternal Fetal Medicine Consultation  Requesting Provider(s): Freda Munro, MD  Reason for consultation: Preterm contractions, fetal deceleration  HPI: April Lyons is a 28 yo G1P0 currently at 29w 4d who was admitted earlier this week due to abdominal pain, preterm contractions and an isolated fetal deceleration.  Her pain has improved over the course of her admission.  Ultrasound yesterday showed a normal cervical length with no evidence of occult abruption.  The fetal tracing has been reassuring since her admission without any additional fetal decelerations.  The fetus is active.  The patient's preterm contractions have improved with po Procardia.  She is otherwise without complaints.  A marginal vs. Low lying placenta was noted on ultrasound yesterday.  Her prenatal course has otherwise been uncomplicated.  OB History: OB History   Grav Para Term Preterm Abortions TAB SAB Ect Mult Living   1               PMH:  Past Medical History  Diagnosis Date  . Anemia   . Hypercholesteremia     Fibroids    PSH: History reviewed. No pertinent past surgical history. Meds:  Scheduled Meds: . docusate sodium  100 mg Oral Daily  . NIFEdipine  10 mg Oral 4 times per day  . prenatal multivitamin  1 tablet Oral Q1200   Continuous Infusions:  PRN Meds:.acetaminophen, calcium carbonate, zolpidem  Allergies: No Known Allergies  FH:  Family History  Problem Relation Age of Onset  . Hypertension Mother   . Asthma Brother   . Cancer Maternal Grandmother     unknown  . Cancer Maternal Grandfather     lung cancer  . Alcohol abuse Paternal Grandfather   Denies family history of birth defects or hereditary disorders  Soc:  History   Social History  . Marital Status: Married    Spouse Name: N/A    Number of Children: N/A  . Years of Education: N/A   Occupational History  . Not on file.   Social History Main Topics  . Smoking status: Never Smoker   . Smokeless tobacco: Never Used   . Alcohol Use: No  . Drug Use: No  . Sexual Activity: Yes    Birth Control/ Protection: Pill   Other Topics Concern  . Not on file   Social History Narrative  . No narrative on file    Review of Systems: no vaginal bleeding or cramping/contractions, no LOF, no nausea/vomiting. All other systems reviewed and are negative.  PE:   Filed Vitals:   08/30/13 0737  BP: 111/58  Pulse: 82  Temp: 98.4 F (36.9 C)  Resp: 18    Please see separate document for fetal ultrasound report.  Labs: CBC    Component Value Date/Time   WBC 13.2* 08/30/2013 0520   WBC 5.0 11/05/2011 1813   RBC 3.87 08/30/2013 0520   RBC 4.57 11/05/2011 1813   HGB 11.2* 08/30/2013 0520   HGB 11.8* 11/05/2011 1813   HCT 33.4* 08/30/2013 0520   HCT 38.3 11/05/2011 1813   PLT 263 08/30/2013 0520   MCV 86.3 08/30/2013 0520   MCV 83.8 11/05/2011 1813   MCH 28.9 08/30/2013 0520   MCH 25.8* 11/05/2011 1813   MCHC 33.5 08/30/2013 0520   MCHC 30.8* 11/05/2011 1813   RDW 13.6 08/30/2013 0520     A/P: 1) Single IUP at [redacted]w[redacted]d         2) Preterm contractions - had normal cervical length on ultrasound yesterday (3.9 cm), and has  completed a course of betamethasone.  Feel that the patient is at low risk for preterm delivery.  May continue po Procardia as needed for preterm contractions.         3) Fetal deceleration - had a single fetal deceleration shortly after admission - the subsequent 48 hrs of monitoring have been reassuring.  Would recommend a BPP prior to discharge - if reassuring, feel that she may be discharged home with continued outpatient management.         4) Low lying vs. Marginal posterior placenta previa - bleeding precautions and pelvic rest precautions given. The patient should be scheduled for a follow up ultrasound for EFW in 3 weeks  - recommend reevaluation of the placental location at that time.         5)  Lagging AC on growth ultrasound yesterday - overall EFW was at the 37th %tile, but AC measured at the 9th %tile.   Recommend follow up growth scan in 3 weeks - please contact our office if you would prefer that this be performed with MFM.         6) Uterine fibroid - likely to be of no clinical significance -reevaluate at follow up.    Thank you for the opportunity to be a part of the care of April Lyons. Please contact our office if we can be of further assistance.   I spent approximately 30 minutes with this patient with over 50% of time spent in face-to-face counseling.  Benjaman Lobe, MD Maternal Fetal Medicine

## 2013-09-01 LAB — URINE CULTURE

## 2013-09-01 LAB — CULTURE, OB URINE: Colony Count: >=100000

## 2013-09-05 NOTE — Discharge Summary (Signed)
  Pt was admitted with abd pain. The fetus was noted to have a decel and the pt was admitted for obs. She had a normal u/s. She was given BMZ and placed on bedrest. She was txd with Procardia for ctxs, The pain resolved. The baby had no further decels and she was sent home to follow up in the office.

## 2013-10-17 LAB — OB RESULTS CONSOLE GBS: GBS: NEGATIVE

## 2013-11-03 ENCOUNTER — Encounter (HOSPITAL_COMMUNITY): Payer: Self-pay | Admitting: Anesthesiology

## 2013-11-03 ENCOUNTER — Encounter (HOSPITAL_COMMUNITY): Payer: Self-pay | Admitting: Advanced Practice Midwife

## 2013-11-03 ENCOUNTER — Encounter (HOSPITAL_COMMUNITY): Payer: Managed Care, Other (non HMO) | Admitting: Anesthesiology

## 2013-11-03 ENCOUNTER — Inpatient Hospital Stay (HOSPITAL_COMMUNITY): Payer: Managed Care, Other (non HMO) | Admitting: Anesthesiology

## 2013-11-03 ENCOUNTER — Inpatient Hospital Stay (HOSPITAL_COMMUNITY)
Admission: AD | Admit: 2013-11-03 | Discharge: 2013-11-06 | DRG: 765 | Disposition: A | Discharge status: Home / Self Care | Payer: Managed Care, Other (non HMO) | Source: Ambulatory Visit | Attending: Obstetrics | Admitting: Obstetrics

## 2013-11-03 ENCOUNTER — Encounter (HOSPITAL_COMMUNITY)
Admission: AD | Disposition: A | Discharge status: Home / Self Care | Payer: Self-pay | Source: Ambulatory Visit | Attending: Obstetrics

## 2013-11-03 ENCOUNTER — Inpatient Hospital Stay (HOSPITAL_COMMUNITY): Payer: Managed Care, Other (non HMO)

## 2013-11-03 DIAGNOSIS — D689 Coagulation defect, unspecified: Secondary | ICD-10-CM | POA: Diagnosis present

## 2013-11-03 DIAGNOSIS — O9902 Anemia complicating childbirth: Secondary | ICD-10-CM | POA: Diagnosis present

## 2013-11-03 DIAGNOSIS — O4290 Premature rupture of membranes, unspecified as to length of time between rupture and onset of labor, unspecified weeks of gestation: Secondary | ICD-10-CM

## 2013-11-03 DIAGNOSIS — D649 Anemia, unspecified: Secondary | ICD-10-CM | POA: Diagnosis present

## 2013-11-03 DIAGNOSIS — O9912 Other diseases of the blood and blood-forming organs and certain disorders involving the immune mechanism complicating childbirth: Secondary | ICD-10-CM

## 2013-11-03 DIAGNOSIS — O4593 Premature separation of placenta, unspecified, third trimester: Secondary | ICD-10-CM | POA: Diagnosis present

## 2013-11-03 DIAGNOSIS — O479 False labor, unspecified: Secondary | ICD-10-CM

## 2013-11-03 DIAGNOSIS — D696 Thrombocytopenia, unspecified: Secondary | ICD-10-CM | POA: Diagnosis present

## 2013-11-03 DIAGNOSIS — O4590 Premature separation of placenta, unspecified, unspecified trimester: Secondary | ICD-10-CM | POA: Diagnosis present

## 2013-11-03 DIAGNOSIS — O4690 Antepartum hemorrhage, unspecified, unspecified trimester: Secondary | ICD-10-CM

## 2013-11-03 HISTORY — DX: Hyperlipidemia, unspecified: E78.5

## 2013-11-03 HISTORY — DX: Dermatitis, unspecified: L30.9

## 2013-11-03 LAB — CBC
HCT: 35.7 % — ABNORMAL LOW (ref 36.0–46.0)
HCT: 20.8 % — ABNORMAL LOW (ref 36.0–46.0)
HEMATOCRIT: 27.7 % — AB (ref 36.0–46.0)
HEMATOCRIT: 26.6 % — AB (ref 36.0–46.0)
HEMOGLOBIN: 6.8 g/dL — AB (ref 12.0–15.0)
HEMOGLOBIN: 8.9 g/dL — AB (ref 12.0–15.0)
Hemoglobin: 11.9 g/dL — ABNORMAL LOW (ref 12.0–15.0)
Hemoglobin: 9 g/dL — ABNORMAL LOW (ref 12.0–15.0)
MCH: 27.4 pg (ref 26.0–34.0)
MCH: 26.7 pg (ref 26.0–34.0)
MCH: 26.9 pg (ref 26.0–34.0)
MCH: 28.2 pg (ref 26.0–34.0)
MCHC: 33.3 g/dL (ref 30.0–36.0)
MCHC: 32.5 g/dL (ref 30.0–36.0)
MCHC: 32.7 g/dL (ref 30.0–36.0)
MCHC: 33.5 g/dL (ref 30.0–36.0)
MCV: 82.3 fL (ref 78.0–100.0)
MCV: 82.2 fL (ref 78.0–100.0)
MCV: 82.2 fL (ref 78.0–100.0)
MCV: 84.2 fL (ref 78.0–100.0)
Platelets: 264 10*3/uL (ref 150–400)
Platelets: 207 10*3/uL (ref 150–400)
Platelets: 171 10*3/uL (ref 150–400)
Platelets: 151 10*3/uL (ref 150–400)
RBC: 4.34 MIL/uL (ref 3.87–5.11)
RBC: 3.37 MIL/uL — AB (ref 3.87–5.11)
RBC: 2.53 MIL/uL — ABNORMAL LOW (ref 3.87–5.11)
RBC: 3.16 MIL/uL — ABNORMAL LOW (ref 3.87–5.11)
RDW: 15.4 % (ref 11.5–15.5)
RDW: 15.3 % (ref 11.5–15.5)
RDW: 15.3 % (ref 11.5–15.5)
RDW: 15.5 % (ref 11.5–15.5)
WBC: 8.4 10*3/uL (ref 4.0–10.5)
WBC: 7.2 10*3/uL (ref 4.0–10.5)
WBC: 6.5 10*3/uL (ref 4.0–10.5)
WBC: 12.9 10*3/uL — ABNORMAL HIGH (ref 4.0–10.5)

## 2013-11-03 LAB — DIC (DISSEMINATED INTRAVASCULAR COAGULATION)PANEL
D-Dimer, Quant: 7 ug/mL-FEU — ABNORMAL HIGH (ref 0.00–0.48)
Platelets: 143 10*3/uL — ABNORMAL LOW (ref 150–400)

## 2013-11-03 LAB — PREPARE RBC (CROSSMATCH)

## 2013-11-03 LAB — ABO/RH: ABO/RH(D): A POS

## 2013-11-03 LAB — DIC (DISSEMINATED INTRAVASCULAR COAGULATION) PANEL
APTT: 31 s (ref 24–37)
FIBRINOGEN: 207 mg/dL (ref 204–475)
INR: 1.19 (ref 0.00–1.49)
PROTHROMBIN TIME: 15.1 s (ref 11.6–15.2)
SMEAR REVIEW: NONE SEEN

## 2013-11-03 LAB — RPR

## 2013-11-03 LAB — MRSA PCR SCREENING: MRSA BY PCR: INVALID — AB

## 2013-11-03 SURGERY — Surgical Case
Anesthesia: Spinal | Site: Abdomen

## 2013-11-03 MED ORDER — SIMETHICONE 80 MG PO CHEW: 80.0000 mg | CHEWABLE_TABLET | ORAL | Status: DC | PRN

## 2013-11-03 MED ORDER — PHENYLEPHRINE 40 MCG/ML (10ML) SYRINGE FOR IV PUSH (FOR BLOOD PRESSURE SUPPORT)
PREFILLED_SYRINGE | INTRAVENOUS | Status: AC
  Filled 2013-11-03: qty 5

## 2013-11-03 MED ORDER — FENTANYL CITRATE 0.05 MG/ML IJ SOLN
INTRAMUSCULAR | Status: DC | PRN
  Administered 2013-11-03: 25 ug via INTRAVENOUS

## 2013-11-03 MED ORDER — OXYTOCIN 40 UNITS IN LACTATED RINGERS INFUSION - SIMPLE MED: 62.5000 mL/h | INTRAVENOUS | Status: AC

## 2013-11-03 MED ORDER — DIPHENHYDRAMINE HCL 50 MG/ML IJ SOLN: 12.5000 mg | INTRAMUSCULAR | Status: DC | PRN

## 2013-11-03 MED ORDER — CEFAZOLIN SODIUM-DEXTROSE 2-3 GM-% IV SOLR
INTRAVENOUS | Status: AC
  Filled 2013-11-03: qty 50

## 2013-11-03 MED ORDER — NALBUPHINE HCL 10 MG/ML IJ SOLN: 5.0000 mg | INTRAMUSCULAR | Status: DC | PRN

## 2013-11-03 MED ORDER — DEXTROSE 5 % IV SOLN
1.0000 ug/kg/h | INTRAVENOUS | Status: DC | PRN
  Filled 2013-11-03: qty 2

## 2013-11-03 MED ORDER — FENTANYL CITRATE 0.05 MG/ML IJ SOLN
INTRAMUSCULAR | Status: AC
  Filled 2013-11-03: qty 2

## 2013-11-03 MED ORDER — OXYCODONE-ACETAMINOPHEN 5-325 MG PO TABS
1.0000 | ORAL_TABLET | ORAL | Status: DC | PRN
  Administered 2013-11-06: 1 via ORAL
  Filled 2013-11-03 (×2): qty 1

## 2013-11-03 MED ORDER — ZOLPIDEM TARTRATE 5 MG PO TABS: 5.0000 mg | ORAL_TABLET | Freq: Every evening | ORAL | Status: DC | PRN

## 2013-11-03 MED ORDER — PRENATAL MULTIVITAMIN CH
1.0000 | ORAL_TABLET | Freq: Every day | ORAL | Status: DC
  Administered 2013-11-03 – 2013-11-05 (×3): 1 via ORAL
  Filled 2013-11-03 (×4): qty 1

## 2013-11-03 MED ORDER — MEPERIDINE HCL 25 MG/ML IJ SOLN: 6.2500 mg | INTRAMUSCULAR | Status: DC | PRN

## 2013-11-03 MED ORDER — DIPHENHYDRAMINE HCL 25 MG PO CAPS: 25.0000 mg | ORAL_CAPSULE | Freq: Four times a day (QID) | ORAL | Status: DC | PRN

## 2013-11-03 MED ORDER — NALOXONE HCL 0.4 MG/ML IJ SOLN: 0.4000 mg | INTRAMUSCULAR | Status: DC | PRN

## 2013-11-03 MED ORDER — METOCLOPRAMIDE HCL 5 MG/ML IJ SOLN: 10.0000 mg | Freq: Three times a day (TID) | INTRAMUSCULAR | Status: DC | PRN

## 2013-11-03 MED ORDER — PHENYLEPHRINE 8 MG IN D5W 100 ML (0.08MG/ML) PREMIX OPTIME
INJECTION | INTRAVENOUS | Status: DC | PRN
  Administered 2013-11-03: 60 ug/min via INTRAVENOUS

## 2013-11-03 MED ORDER — DIBUCAINE 1 % RE OINT: 1.0000 "application " | TOPICAL_OINTMENT | RECTAL | Status: DC | PRN

## 2013-11-03 MED ORDER — ONDANSETRON HCL 4 MG/2ML IJ SOLN
INTRAMUSCULAR | Status: AC
  Filled 2013-11-03: qty 2

## 2013-11-03 MED ORDER — DIPHENHYDRAMINE HCL 25 MG PO CAPS: 25.0000 mg | ORAL_CAPSULE | ORAL | Status: DC | PRN

## 2013-11-03 MED ORDER — SCOPOLAMINE 1 MG/3DAYS TD PT72
MEDICATED_PATCH | TRANSDERMAL | Status: AC
  Administered 2013-11-03: 1.5 mg via TRANSDERMAL
  Filled 2013-11-03: qty 1

## 2013-11-03 MED ORDER — LACTATED RINGERS IV SOLN
INTRAVENOUS | Status: DC | PRN
  Administered 2013-11-03: 04:00:00 via INTRAVENOUS

## 2013-11-03 MED ORDER — SENNOSIDES-DOCUSATE SODIUM 8.6-50 MG PO TABS
2.0000 | ORAL_TABLET | ORAL | Status: DC
  Administered 2013-11-03 – 2013-11-05 (×3): 2 via ORAL
  Filled 2013-11-03 (×3): qty 2

## 2013-11-03 MED ORDER — SODIUM CHLORIDE 0.9 % IJ SOLN: 3.0000 mL | INTRAMUSCULAR | Status: DC | PRN

## 2013-11-03 MED ORDER — LACTATED RINGERS IV SOLN
INTRAVENOUS | Status: DC
  Administered 2013-11-03 (×2): via INTRAVENOUS

## 2013-11-03 MED ORDER — CITRIC ACID-SODIUM CITRATE 334-500 MG/5ML PO SOLN
ORAL | Status: AC
  Filled 2013-11-03: qty 15

## 2013-11-03 MED ORDER — MENTHOL 3 MG MT LOZG: 1.0000 | LOZENGE | OROMUCOSAL | Status: DC | PRN

## 2013-11-03 MED ORDER — SCOPOLAMINE 1 MG/3DAYS TD PT72
1.0000 | MEDICATED_PATCH | Freq: Once | TRANSDERMAL | Status: AC
  Administered 2013-11-03: 1.5 mg via TRANSDERMAL

## 2013-11-03 MED ORDER — ATROPINE SULFATE 0.4 MG/ML IJ SOLN
INTRAMUSCULAR | Status: DC | PRN
  Administered 2013-11-03: 0.4 mg via INTRAVENOUS

## 2013-11-03 MED ORDER — CEFAZOLIN SODIUM-DEXTROSE 2-3 GM-% IV SOLR
2.0000 g | Freq: Three times a day (TID) | INTRAVENOUS | Status: DC
  Administered 2013-11-03: 2 g via INTRAVENOUS

## 2013-11-03 MED ORDER — MORPHINE SULFATE 0.5 MG/ML IJ SOLN
INTRAMUSCULAR | Status: AC
  Filled 2013-11-03: qty 10

## 2013-11-03 MED ORDER — TETANUS-DIPHTH-ACELL PERTUSSIS 5-2.5-18.5 LF-MCG/0.5 IM SUSP
0.5000 mL | Freq: Once | INTRAMUSCULAR | Status: DC
  Filled 2013-11-03: qty 0.5

## 2013-11-03 MED ORDER — LANOLIN HYDROUS EX OINT: 1.0000 "application " | TOPICAL_OINTMENT | CUTANEOUS | Status: DC | PRN

## 2013-11-03 MED ORDER — ONDANSETRON HCL 4 MG/2ML IJ SOLN: 4.0000 mg | Freq: Three times a day (TID) | INTRAMUSCULAR | Status: DC | PRN

## 2013-11-03 MED ORDER — ONDANSETRON HCL 4 MG PO TABS: 4.0000 mg | ORAL_TABLET | ORAL | Status: DC | PRN

## 2013-11-03 MED ORDER — EPHEDRINE SULFATE 50 MG/ML IJ SOLN
INTRAMUSCULAR | Status: DC | PRN
  Administered 2013-11-03: 20 mg via INTRAVENOUS

## 2013-11-03 MED ORDER — OXYTOCIN 10 UNIT/ML IJ SOLN
40.0000 [IU] | INTRAVENOUS | Status: DC | PRN
  Administered 2013-11-03: 40 [IU] via INTRAVENOUS

## 2013-11-03 MED ORDER — IBUPROFEN 600 MG PO TABS
600.0000 mg | ORAL_TABLET | Freq: Four times a day (QID) | ORAL | Status: DC
  Administered 2013-11-03 – 2013-11-06 (×11): 600 mg via ORAL
  Filled 2013-11-03 (×12): qty 1

## 2013-11-03 MED ORDER — OXYTOCIN 10 UNIT/ML IJ SOLN
INTRAMUSCULAR | Status: AC
  Filled 2013-11-03: qty 4

## 2013-11-03 MED ORDER — KETOROLAC TROMETHAMINE 30 MG/ML IJ SOLN: 30.0000 mg | Freq: Four times a day (QID) | INTRAMUSCULAR | Status: AC | PRN

## 2013-11-03 MED ORDER — OXYCODONE-ACETAMINOPHEN 5-325 MG PO TABS: 2.0000 | ORAL_TABLET | ORAL | Status: DC | PRN

## 2013-11-03 MED ORDER — SIMETHICONE 80 MG PO CHEW
80.0000 mg | CHEWABLE_TABLET | Freq: Three times a day (TID) | ORAL | Status: DC
  Administered 2013-11-03 – 2013-11-06 (×8): 80 mg via ORAL
  Filled 2013-11-03 (×7): qty 1

## 2013-11-03 MED ORDER — ONDANSETRON HCL 4 MG/2ML IJ SOLN
INTRAMUSCULAR | Status: DC | PRN
  Administered 2013-11-03: 4 mg via INTRAVENOUS

## 2013-11-03 MED ORDER — WITCH HAZEL-GLYCERIN EX PADS: 1.0000 "application " | MEDICATED_PAD | CUTANEOUS | Status: DC | PRN

## 2013-11-03 MED ORDER — ONDANSETRON HCL 4 MG/2ML IJ SOLN
4.0000 mg | INTRAMUSCULAR | Status: DC | PRN
Start: 2013-11-03 — End: 2013-11-06

## 2013-11-03 MED ORDER — MORPHINE SULFATE (PF) 0.5 MG/ML IJ SOLN
INTRAMUSCULAR | Status: DC | PRN
  Administered 2013-11-03: .1 mg via INTRATHECAL

## 2013-11-03 MED ORDER — CITRIC ACID-SODIUM CITRATE 334-500 MG/5ML PO SOLN
30.0000 mL | Freq: Once | ORAL | Status: AC
  Administered 2013-11-03: 30 mL via ORAL

## 2013-11-03 MED ORDER — PHENYLEPHRINE 8 MG IN D5W 100 ML (0.08MG/ML) PREMIX OPTIME
INJECTION | INTRAVENOUS | Status: AC
  Filled 2013-11-03: qty 100

## 2013-11-03 MED ORDER — SIMETHICONE 80 MG PO CHEW
80.0000 mg | CHEWABLE_TABLET | ORAL | Status: DC
  Administered 2013-11-03 – 2013-11-05 (×3): 80 mg via ORAL
  Filled 2013-11-03 (×3): qty 1

## 2013-11-03 MED ORDER — HYDROMORPHONE HCL PF 1 MG/ML IJ SOLN: 0.2500 mg | INTRAMUSCULAR | Status: DC | PRN

## 2013-11-03 MED ORDER — DIPHENHYDRAMINE HCL 50 MG/ML IJ SOLN: 25.0000 mg | INTRAMUSCULAR | Status: DC | PRN

## 2013-11-03 MED ORDER — SODIUM CHLORIDE 0.9 % IV SOLN
Freq: Once | INTRAVENOUS | Status: DC
Start: 2013-11-03 — End: 2013-11-03

## 2013-11-03 MED ORDER — LACTATED RINGERS IV BOLUS (SEPSIS)
1000.0000 mL | Freq: Once | INTRAVENOUS | Status: AC
  Administered 2013-11-03: 1000 mL via INTRAVENOUS

## 2013-11-03 MED ORDER — ATROPINE SULFATE 0.4 MG/ML IJ SOLN
INTRAMUSCULAR | Status: AC
  Filled 2013-11-03: qty 1

## 2013-11-03 MED ORDER — SODIUM CHLORIDE 0.9 % IV SOLN: Freq: Once | INTRAVENOUS | Status: DC

## 2013-11-03 SURGICAL SUPPLY — 36 items
BENZOIN TINCTURE PRP APPL 2/3 (GAUZE/BANDAGES/DRESSINGS) ×3 IMPLANT
BLADE SURG 10 STRL SS (BLADE) ×6 IMPLANT
CLAMP CORD UMBIL (MISCELLANEOUS) IMPLANT
CLOSURE WOUND 1/2 X4 (GAUZE/BANDAGES/DRESSINGS) ×1
CLOTH BEACON ORANGE TIMEOUT ST (SAFETY) ×3 IMPLANT
DERMABOND ADVANCED (GAUZE/BANDAGES/DRESSINGS)
DERMABOND ADVANCED .7 DNX12 (GAUZE/BANDAGES/DRESSINGS) IMPLANT
DRAPE LG THREE QUARTER DISP (DRAPES) IMPLANT
DRSG OPSITE POSTOP 4X10 (GAUZE/BANDAGES/DRESSINGS) ×3 IMPLANT
DURAPREP 26ML APPLICATOR (WOUND CARE) ×3 IMPLANT
ELECT REM PT RETURN 9FT ADLT (ELECTROSURGICAL) ×3
ELECTRODE REM PT RTRN 9FT ADLT (ELECTROSURGICAL) ×1 IMPLANT
EXTRACTOR VACUUM M CUP 4 TUBE (SUCTIONS) IMPLANT
EXTRACTOR VACUUM M CUP 4' TUBE (SUCTIONS)
GLOVE BIO SURGEON STRL SZ 6 (GLOVE) ×3 IMPLANT
GLOVE INDICATOR 6.0 STRL GRN (GLOVE) ×3 IMPLANT
GOWN STRL REUS W/TWL LRG LVL3 (GOWN DISPOSABLE) ×6 IMPLANT
KIT ABG SYR 3ML LUER SLIP (SYRINGE) IMPLANT
NEEDLE HYPO 25X5/8 SAFETYGLIDE (NEEDLE) ×6 IMPLANT
NS IRRIG 1000ML POUR BTL (IV SOLUTION) ×3 IMPLANT
PACK C SECTION WH (CUSTOM PROCEDURE TRAY) ×3 IMPLANT
PAD OB MATERNITY 4.3X12.25 (PERSONAL CARE ITEMS) ×3 IMPLANT
RTRCTR C-SECT PINK 25CM LRG (MISCELLANEOUS) IMPLANT
STRIP CLOSURE SKIN 1/2X4 (GAUZE/BANDAGES/DRESSINGS) ×2 IMPLANT
SUT MNCRL 0 VIOLET CTX 36 (SUTURE) ×2 IMPLANT
SUT MNCRL AB 3-0 PS2 27 (SUTURE) ×3 IMPLANT
SUT MONOCRYL 0 CTX 36 (SUTURE) ×4
SUT PLAIN 0 NONE (SUTURE) IMPLANT
SUT PLAIN 2 0 XLH (SUTURE) ×3 IMPLANT
SUT VIC AB 0 CT1 27 (SUTURE) ×4
SUT VIC AB 0 CT1 27XBRD ANBCTR (SUTURE) ×2 IMPLANT
SUT VIC AB 2-0 CT1 27 (SUTURE)
SUT VIC AB 2-0 CT1 TAPERPNT 27 (SUTURE) IMPLANT
TOWEL OR 17X24 6PK STRL BLUE (TOWEL DISPOSABLE) ×3 IMPLANT
TRAY FOLEY CATH 14FR (SET/KITS/TRAYS/PACK) ×3 IMPLANT
WATER STERILE IRR 1000ML POUR (IV SOLUTION) ×3 IMPLANT

## 2013-11-03 NOTE — Anesthesia Procedure Notes (Signed)
Spinal  Patient location during procedure: OR Preanesthetic Checklist Completed: patient identified, site marked, surgical consent, pre-op evaluation, timeout performed, IV checked, risks and benefits discussed and monitors and equipment checked Spinal Block Patient position: sitting Prep: DuraPrep Patient monitoring: heart rate, cardiac monitor, continuous pulse ox and blood pressure Approach: midline Location: L3-4 Injection technique: single-shot Needle Needle type: Sprotte  Needle gauge: 24 G Needle length: 9 cm Assessment Sensory level: T4 Additional Notes Spinal Dosage in OR  Bupivicaine ml       1.9 PFMS04   mcg        100

## 2013-11-03 NOTE — Progress Notes (Signed)
LR bolus started

## 2013-11-03 NOTE — MAU Provider Note (Signed)
Patient seen and discussed w Hansel Feinstein, CNM Placental abruption.  Pt admitted for cesarean delivery.

## 2013-11-03 NOTE — Anesthesia Postprocedure Evaluation (Signed)
  Anesthesia Post-op Note  Patient: April Lyons  Procedure(s) Performed: Procedure(s): CESAREAN SECTION (N/A)  Patient Location: PACU and A-ICU  Anesthesia Type:Spinal  Level of Consciousness: awake, alert  and oriented  Airway and Oxygen Therapy: Patient Spontanous Breathing  Post-op Pain: mild  Post-op Assessment: Post-op Vital signs reviewed  Post-op Vital Signs: Reviewed and stable  Last Vitals:  Filed Vitals:   11/03/13 1629  BP:   Pulse: 74  Temp:   Resp:     Complications: No apparent anesthesia complications

## 2013-11-03 NOTE — Progress Notes (Signed)
Large amount of blood filling large pad with clots noted. Pad changed.

## 2013-11-03 NOTE — Progress Notes (Signed)
Foley Catheter inserted by Carver Fila, RN

## 2013-11-03 NOTE — Addendum Note (Signed)
Addendum created 11/03/13 1721 by Genevie Ann, CRNA   Modules edited: Notes Section   Notes Section:  File: [id number]

## 2013-11-03 NOTE — Progress Notes (Signed)
In to see and examine patient.   She reports mild dizziness, but improved.  She is alert and oriented throughout the conversation. A repeat post op hgb was drawn 2/2 BPs in the 70-80s/40s despite fluid resucitation.  It returned 6.8. After 1 unit of PRBCs, BPs remained low so a second unit was transfused.  Bleeding post op has been appropriate.  BP 91/53  Pulse 69  Temp(Src) 97.2 F (36.2 C) (Oral)  Resp 17  Ht 5\' 10"  (1.778 m)  Wt 68.947 kg (152 lb)  BMI 21.81 kg/m2  SpO2 100% NAD RRR CTAB, no crackles Fundus is firm, bleeding is appropriate Ext: + SCDs  Lab Results  Component Value Date   WBC 6.5 11/03/2013   HGB 6.8* 11/03/2013   HCT 20.8* 11/03/2013   MCV 82.2 11/03/2013   PLT 143* 11/03/2013    2nd unit PRBCs infusing now. Repeat labs pending, including coags. Will plan repeat cbc 4 hrs after completion of transfusion.  She is T&C for an additional 2 units of PRBCs Will transfer to AICU for close BP monitoring today.

## 2013-11-03 NOTE — Brief Op Note (Signed)
11/03/2013  5:11 AM  PATIENT:  April Lyons  28 y.o. female  PRE-OPERATIVE DIAGNOSIS:  Bleeding  POST-OPERATIVE DIAGNOSIS:  Bleeding Placental Abruption  PROCEDURE:  Procedure(s): CESAREAN SECTION (N/A)  SURGEON:  Surgeon(s) and Role:    * Jerelyn Charles, MD - Primary  PHYSICIAN ASSISTANT:   ASSISTANTS: none   ANESTHESIA:   spinal  EBL:  Total I/O In: 2200 [I.V.:2200] Out: 1000 [Urine:200; Blood:800] 1500 cc EBL prior to OR. Total EBL 2.3L  BLOOD ADMINISTERED:none  DRAINS: Urinary Catheter (Foley)   LOCAL MEDICATIONS USED:  NONE  SPECIMEN:  Source of Specimen:  placenta  DISPOSITION OF SPECIMEN:  PATHOLOGY  COUNTS:  YES  TOURNIQUET:  * No tourniquets in log *  DICTATION: .Note written in EPIC  PLAN OF CARE: Admit to inpatient   PATIENT DISPOSITION:  PACU - hemodynamically stable.   Delay start of Pharmacological VTE agent (>24hrs) due to surgical blood loss or risk of bleeding: not applicable

## 2013-11-03 NOTE — Consult Note (Signed)
Neonatology Note:   Attendance at C-section:    I was asked by Dr. Carlis Abbott to attend this primary C/S at term due to vaginal bleeding The mother is a G1P0 A pos, GBS neg with onset of heavy vaginal bleeding. ROM at delivery, fluid clear. Vacuum-assisted delivery. Infant vigorous with good spontaneous cry and tone. Needed bulb suctioning several times for increased oral secretions. Ap 8/9. Lungs clear to ausc in DR. To CN to care of Pediatrician.   Real Cons, MD

## 2013-11-03 NOTE — Transfer of Care (Signed)
Immediate Anesthesia Transfer of Care Note  Patient: April Lyons  Procedure(s) Performed: Procedure(s): CESAREAN SECTION (N/A)  Patient Location: PACU  Anesthesia Type:Spinal  Level of Consciousness: awake, alert  and oriented  Airway & Oxygen Therapy: Patient Spontanous Breathing  Post-op Assessment: Report given to PACU RN and Post -op Vital signs reviewed and stable  Post vital signs: Reviewed and stable  Complications: No apparent anesthesia complications

## 2013-11-03 NOTE — MAU Provider Note (Signed)
History     CSN: [id number]  Arrival date and time: 11/03/13 0207   None     Chief Complaint  Patient presents with  . Vaginal Bleeding   Vaginal Bleeding Associated symptoms include abdominal pain. Pertinent negatives include no chills, constipation, diarrhea, fever, nausea or vomiting.   This is a 28 y.o. female at [redacted]w[redacted]d who presents with c/o heavy bleeding. Has some pain with contractions. Was admitted in early July for contractions and FHR decel. Placenta was noted to be "marginal previa or low-lying, with inferior edge 1.1cm from os".  Patient reported to [place] on admission that she does not have a low placenta and has been checked a lot in office. States is scheduled for IOL next week "because the baby's stomach is small".   OB History   Grav Para Term Preterm Abortions TAB SAB Ect Mult Living   1               Past Medical History  Diagnosis Date  . Anemia   . Hypercholesteremia     Fibroids  . Hyperlipidemia   . Eczema     History reviewed. No pertinent past surgical history.  Family History  Problem Relation Age of Onset  . Hypertension Mother   . Asthma Brother   . Cancer Maternal Grandmother     unknown  . Cancer Maternal Grandfather     lung cancer  . Alcohol abuse Paternal Grandfather     History  Substance Use Topics  . Smoking status: Never Smoker   . Smokeless tobacco: Never Used  . Alcohol Use: No    Allergies: No Known Allergies  Prescriptions prior to admission  Medication Sig Dispense Refill  . NIFEdipine (PROCARDIA) 10 MG capsule Take 1 capsule (10 mg total) by mouth every 6 (six) hours.  50 capsule  0  . Prenatal Vit-Fe Fumarate-FA (PRENATAL MULTIVITAMIN) TABS tablet Take 1 tablet by mouth daily at 12 noon.        Review of Systems  Constitutional: Negative for fever, chills and malaise/fatigue.  Gastrointestinal: Positive for abdominal pain. Negative for nausea, vomiting, diarrhea and constipation.  Genitourinary: Positive for  vaginal bleeding.       Vaginal bleeding    Physical Exam   Blood pressure 140/85, pulse 77, temperature 98.6 F (37 C), temperature source Oral, resp. rate 18.  Physical Exam  Constitutional: She is oriented to person, place, and time. She appears well-developed and well-nourished. No distress.  HENT:  Head: Normocephalic.  Cardiovascular: Normal rate.   Respiratory: Effort normal.  GI: Soft. She exhibits no distension. There is no tenderness. There is no rebound and no guarding.  Genitourinary: Uterus normal. Vaginal discharge (heavy vaginal bleeding on admit, now slowed, clotted) found.  Dilation: 2 Effacement (%): 70 Station: -3 Exam by:: Hansel Feinstein CNM   Musculoskeletal: Normal range of motion.  Neurological: She is alert and oriented to person, place, and time.  Skin: Skin is warm and dry.  Psychiatric: She has a normal mood and affect.   FHR reassuring, one variable decel, small UCs irregular every 2-5 minutes  MAU Course  Procedures  MDM [place] ordered to eval placenta and AFI  >> Placenta posterior, AFI 14.58    Bleeding resumed at 0300. Dr Carlis Abbott called back and is coming in.   Assessment and Plan  A:  SIUP at [redacted]w[redacted]d        Third Trimester bleeding       Irregular contractions  P;   Dr  Clark coming in.        Has IV and blood work sent  [facility] 11/03/2013, 2:24 AM

## 2013-11-03 NOTE — Progress Notes (Signed)
POD#0 s/p 1 LTCS 2/2 placental abruption  EBL prior to delivery 1500cc EBL intraop: 200cc during spinal placement, 600 cc during procedure  Total EBL 2300cc  Hgb 11.9 on arrival, 9.0 immediately prior to delivery  Will repeat CBC at 0700.  Patient is consented for blood transfusion.

## 2013-11-03 NOTE — Anesthesia Preprocedure Evaluation (Signed)
Anesthesia Evaluation  Patient identified by MRN, date of birth, ID band Patient awake    Reviewed: Allergy & Precautions, H&P , Patient's Chart, lab work & pertinent test results  Airway Mallampati: II TM Distance: >3 FB Neck ROM: full    Dental no notable dental hx.    Pulmonary  breath sounds clear to auscultation  Pulmonary exam normal       Cardiovascular Exercise Tolerance: Good Rhythm:regular Rate:Normal     Neuro/Psych    GI/Hepatic   Endo/Other    Renal/GU      Musculoskeletal   Abdominal   Peds  Hematology  (+) anemia ,   Anesthesia Other Findings Alert, warm, dry skin. Signif Blood loss in bed.  2 IV's  Blood available  Reproductive/Obstetrics                           Anesthesia Physical Anesthesia Plan  ASA: II and emergent  Anesthesia Plan: Spinal   Post-op Pain Management:    Induction:   Airway Management Planned:   Additional Equipment:   Intra-op Plan:   Post-operative Plan:   Informed Consent: I have reviewed the patients History and Physical, chart, labs and discussed the procedure including the risks, benefits and alternatives for the proposed anesthesia with the patient or authorized representative who has indicated his/her understanding and acceptance.   Dental Advisory Given  Plan Discussed with: CRNA  Anesthesia Plan Comments: (Lab work confirmed with CRNA in room. Platelets okay. Discussed spinal anesthetic, and patient consents to the procedure:  included risk of possible headache,backache, failed block, allergic reaction, and nerve injury. This patient was asked if she had any questions or concerns before the procedure started. )        Anesthesia Quick Evaluation

## 2013-11-03 NOTE — Progress Notes (Signed)
POD #0 Patient is comfortable.  Pain control is good.  She denies heavy bleeding, and has no other complaints.  Filed Vitals:   11/03/13 1030 11/03/13 1100 11/03/13 1130 11/03/13 1200  BP: 103/53 94/58 103/50 102/51  Pulse: 74 57 65 74  Temp:      TempSrc:      Resp:  16  16  Height:      Weight:      SpO2: 99% 98% 98% 99%    Fundus firm Abd: soft, NT Ext: no CT, SCDs in place  Lab Results  Component Value Date   WBC 12.9* 11/03/2013   HGB 8.9* 11/03/2013   HCT 26.6* 11/03/2013   MCV 84.2 11/03/2013   PLT 151 11/03/2013    --/--/A POS, A POS (09/04 0220)  A/P Post op day # 0 s/p emergency c/s fo placental abruption. Anemia: s/p 2U PBRCs, with Hb climbing from 6.8 to 8.9 DIC labs wnl, platelets stable and slightly improved. Baby doing well, skin to skin with mom. Will advance diet as necessary, continue to monitor in AICU today, may transfer to postpartum this evening if remaining stable. Repeat CBC in am. Other Routine care.     Allyn Kenner

## 2013-11-03 NOTE — H&P (Signed)
28 y.o. [redacted]w[redacted]d  G1P0 comes in c/o vaginal bleeding.  She was noted to be contracting q2-3 min with brisk bleeding on exam by Hansel Feinstein, CNM.  On my arrival to the MAU, she had filled approximately 3 chux pads with blood.  She became difficult to arouse and hypotensive (80/50) with resultant fetal deceleration.  Upon fluid resuscitation, BP improved and fetal heart tracing returned to baseline.  Stat CBC was repeat and showed hgb drop from 11.9 to 9.0.  SVE was 4/70, with brisk bleeding and clots after exam (~200cc).  Total EBL in MAU was approximately 1500cc.   Past Medical History  Diagnosis Date  . Anemia   . Hypercholesteremia     Fibroids  . Hyperlipidemia   . Eczema    History reviewed. No pertinent past surgical history.  OB History  Gravida Para Term Preterm AB SAB TAB Ectopic Multiple Living  1             # Outcome Date GA Lbr Len/2nd Weight Sex Delivery Anes PTL Lv  1 CUR               History   Social History  . Marital Status: Married    Spouse Name: N/A    Number of Children: N/A  . Years of Education: N/A   Occupational History  . Not on file.   Social History Main Topics  . Smoking status: Never Smoker   . Smokeless tobacco: Never Used  . Alcohol Use: No  . Drug Use: No  . Sexual Activity: Yes    Birth Control/ Protection: Pill, None   Other Topics Concern  . Not on file   Social History Narrative  . No narrative on file   Review of patient's allergies indicates no known allergies.    Prenatal Transfer Tool  Maternal Diabetes: No Genetic Screening: Normal Maternal Ultrasounds/Referrals: Abnormal:  Findings:   Other: Low lying placenta on anatomy scan, resolved.  Lagging AC <3% with normal UA dopplers Fetal Ultrasounds or other Referrals:  Other:  Maternal Substance Abuse:  No Significant Maternal Medications:  None Significant Maternal Lab Results: None  Other PNC: Lagging AC with normal UA dopplers.    Filed Vitals:   11/03/13 0350  BP:  100/48  Pulse: 88  Temp:   Resp:      General: initially difficult to arouse, responded to sternal rub Abdomen:  soft, gravid.  Relaxing between contractions Ex:  no cords, erythema.  Cool and clammy SVE:  4/70/-1, brisk bleeding with exam.  FHTs:  120s, mod variabiilty, 5 min decel to the 90s during episode of maternal hypotension, improved to baseline w occ variable.  Toco:  q2 min   A/P  28 yo G1 @ [redacted]w[redacted]d w placental abruption. EBL at this time at least 1500cc.  Hgb 9, decreased from 11.9 on admission. Upon repeat vaginal exam, bleeding increased briskly.  Given that Ms Soohoo is a G1 remote from delivery with significant blood loss that is resulting in hemodynamic changes, feel that the safest mode of delivery at this time is cesarean section.  She was consented for c/s.  Discussed r/b/a, including risk of infection, bleeding, damage to surrounding structures, need for additional procedures.  She was consented for blood products.  She has 2 IVs in place and 2 units of PRBCs available for the OR.     [place], [facility]

## 2013-11-03 NOTE — Op Note (Signed)
Cesarean Section Procedure Note  Indications: placental abruption and maternal hemorrhage   Pre-operative Diagnosis: IUP @ [redacted]w[redacted]d, placental abruption   Post-operative Diagnosis: same   Surgeon: Jerelyn Charles, MD   Estimated Blood Loss: 820mL intraop, 1500 cc prior to delivery.  Total EBL 2300 cc  Drains: Foley catheter   Specimens: Placenta to pathology   Implants: none   Complications: None; patient tolerated the procedure well.   Disposition: PACU - hemodynamically stable.   Anesthesia: Spinal anesthesia  Findings: Viable female infant, delivered from the cephalic position,  Apgars 8, 9.  4 cm posterior pedunculated fibroid, small subserosal anterior left lateral fibroid.  Normal ovaries and tubes bilaterally.  200 cc clot delivered with placenta.   Procedure Details  The patient was counseled about the risks, benefits, complications of the cesarean section. The patient concurred with the proposed plan, giving informed consent. The patient was taken to Operating Room. Spinal anesthesia was obtained. A Time Out was held and the patient's name, consent and procedure were verified. information confirmed.  After spinal was found to adequate , the patient was placed in the dorsal supine position with a leftward tilt, draped and prepped in the usual sterile manner. A Pfannenstiel incision was made and carried down through the subcutaneous tissue to the fascia. The fascia was incised in the midline and the fascial incision was extended laterally with Mayo scissors. The superior aspect of the fascial incision was grasped with Kocher clamps x2, tented up and the rectus muscles dissected off bluntly. The rectus was then dissected off with blunt dissection and Mayo scissors inferiorly. The rectus muscles were separated in the midline. The abdominal peritoneum was identified, tented up, entered bluntly. The bladder blade was inserted. The vesicouterine peritoneum was identified, tented up, entered  sharply with Metzenbaum scissors, and the bladder flap was created digitally.  A well developed lower uterine segment was identified. Scalpel was then used to make a low transverse incision on the uterus which was extended laterally with blunt dissection. The fetal head was identified, brought to the hysterotomy.  Usual traction on the uterus did not deliver a fetal head.  A Kiwi vacuum was applied.  Appropriate suction was applied and with one pop off the fetal head was delivered through the hysterotomy.  The suction was removed. The cord was cut and clamped, and the baby handed off to the neonatologist. Placenta was then delivered spontaneously, intact and with a large blood clot, the uterus was cleared of all clot and debris. The uterine incision was repaired with #0 Monocryl in running locked fashion.  The incision was hemostatic. Uterine tone was excellent. Ovaries and tubes were inspected and normal. The abdominal cavity was cleared of all clot and debris. Thre rectus were inspected and hemostatic. The fascia was closed with 0 Vicryl in a running fashion. The subcuticular layer was irrigated and all bleeders cauterized.  The skin was closed with 3-0 monocryl in a subcuticular fashio. The incision was dressed with benzoine, steri strips and pressure dressing. All sponge lap and needle counts were correct x3. Patient tolerated the procedure well and recovered in stable condition following the procedure.  Instrument, sponge, and needle counts were correct prior the abdominal closure and at the conclusion of the case.    [place], [facility]

## 2013-11-03 NOTE — Anesthesia Postprocedure Evaluation (Signed)
Anesthesia Post Note  Patient: April Lyons  Procedure(s) Performed: Procedure(s) (LRB): CESAREAN SECTION (N/A)  Anesthesia type: Spinal  Patient location: PACU  Post pain: Pain level controlled  Post assessment: Post-op Vital signs reviewed  Last Vitals:  Filed Vitals:   11/03/13 0730  BP: 97/50  Pulse: 66  Temp: 36.4 C  Resp: 14    Post vital signs: Reviewed  Level of consciousness: awake  Complications: No apparent anesthesia complications

## 2013-11-04 LAB — CBC
HEMATOCRIT: 23.5 % — AB (ref 36.0–46.0)
Hemoglobin: 7.8 g/dL — ABNORMAL LOW (ref 12.0–15.0)
MCH: 28.1 pg (ref 26.0–34.0)
MCHC: 33.2 g/dL (ref 30.0–36.0)
MCV: 84.5 fL (ref 78.0–100.0)
Platelets: 145 10*3/uL — ABNORMAL LOW (ref 150–400)
RBC: 2.78 MIL/uL — ABNORMAL LOW (ref 3.87–5.11)
RDW: 15.9 % — AB (ref 11.5–15.5)
WBC: 11.6 10*3/uL — ABNORMAL HIGH (ref 4.0–10.5)

## 2013-11-04 MED ORDER — FERROUS SULFATE 325 (65 FE) MG PO TABS
325.0000 mg | ORAL_TABLET | Freq: Two times a day (BID) | ORAL | Status: DC
  Administered 2013-11-04 – 2013-11-06 (×5): 325 mg via ORAL
  Filled 2013-11-04 (×6): qty 1

## 2013-11-04 NOTE — Lactation Note (Signed)
This note was copied from the chart of April Laurin Riechers. Lactation Consultation Note Follow up visit at 39 hours visitors attempting to wake baby for feeding now.  Mom is able to hand express colostrum.  Placed baby STS at breast.  Baby latches well with assist, but does not maintain suck with stimulation.  Baby is sleepy and continues STS after visit.  Family asked questions about nipple care, engorgement and pumping.  All questions answered and discussed.  Reference to baby and me booklet and out pt. Services.    Patient Name: April Lyons [id number] Date: 11/04/2013 Reason for consult: Follow-up assessment   Maternal Data    Feeding Feeding Type: Breast Fed Length of feed: 10 min  LATCH Score/Interventions Latch: Repeated attempts needed to sustain latch, nipple held in mouth throughout feeding, stimulation needed to elicit sucking reflex. Intervention(s): Adjust position;Assist with latch;Breast massage;Breast compression  Audible Swallowing: None Intervention(s): Skin to skin;Hand expression;Alternate breast massage  Type of Nipple: Everted at rest and after stimulation  Comfort (Breast/Nipple): Soft / non-tender     Hold (Positioning): Assistance needed to correctly position infant at breast and maintain latch. Intervention(s): Skin to skin;Position options;Support Pillows;Breastfeeding basics reviewed  LATCH Score: 6  Lactation Tools Discussed/Used     Consult Status Consult Status: Follow-up Date: 11/05/13 Follow-up type: In-patient    Petr Bontempo, Justine Null 11/04/2013, 8:19 PM

## 2013-11-04 NOTE — Progress Notes (Signed)
Patient is eating, ambulating, voiding.  Pain control is good.  Appropriate lochia. +flatus. Feeling "great".  No complaints.  No dizziness/lightheadedness.  No CP/SOB.  Filed Vitals:   11/03/13 1726 11/03/13 1909 11/03/13 2324 11/04/13 0313  BP:  118/55 109/46 125/76  Pulse:  70 66 77  Temp:  98.5 F (36.9 C) 98.5 F (36.9 C) 98.8 F (37.1 C)  TempSrc:  Oral Oral Oral  Resp: 18 18 20 18   Height:      Weight:      SpO2: 100% 98% 96% 97%    Fundus firm Perineum without swelling. Inc: c/d/i Ext: no CT, SCDs in place  Lab Results  Component Value Date   WBC 11.6* 11/04/2013   HGB 7.8* 11/04/2013   HCT 23.5* 11/04/2013   MCV 84.5 11/04/2013   PLT 145* 11/04/2013    --/--/A POS, A POS (09/04 0220)  A/P Post op day #1 s/p emergency c/s for placental abruption Anemia: EBL approx 2300cc, s/p 2 U PRBCs, Today's am Hb 7.8.  VSS, asymptomatic.  Will start Ferrous sulfate 325mg  bid, colace prn.  Pt bleeding under good control, tolerating oral intake.  Will d/c bilateral IVs in hands.  Routine care.    Allyn Kenner

## 2013-11-05 NOTE — Lactation Note (Signed)
This note was copied from the chart of April Yesly Mcenroe. Lactation Consultation Note Follow up visit at 56 hours of age.  Mom is hold baby STS on chest and reports breastfeeding is going well. Baby has had 14 feeding 4 stools and 4 voids in the past 24 hours.  Latch scores have been 8 without any swallows recorded.  Encouraged parents to listen for a "K" sound with pausing at the breast,  Mom is confident she sees swallows, but wasn't hearing gulping, she now knows what to listen for.  Questions answered, mom denies further concerns at this time.    Patient Name: April Lyons [id number] Date: 11/05/2013 Reason for consult: Follow-up assessment   Maternal Data    Feeding Feeding Type: Breast Fed Length of feed: 15 min  LATCH Score/Interventions                Intervention(s): Breastfeeding basics reviewed     Lactation Tools Discussed/Used     Consult Status Consult Status: Follow-up Date: 11/06/13 Follow-up type: In-patient    April Lyons, Justine Null 11/05/2013, 8:11 PM

## 2013-11-05 NOTE — Progress Notes (Addendum)
Patient is eating, ambulating, voiding.  Pain control is good.  Appropriate lochia.  Some nipple pain with initiation of breast feeding.  No other problems.  Filed Vitals:   11/03/13 2324 11/04/13 0313 11/04/13 1745 11/05/13 0600  BP: 109/46 125/76 106/53 117/76  Pulse: 66 77 79 76  Temp: 98.5 F (36.9 C) 98.8 F (37.1 C) 99.1 F (37.3 C) 98.4 F (36.9 C)  TempSrc: Oral Oral Oral   Resp: 20 18 18 18   Height:      Weight:      SpO2: 96% 97%      Fundus firm Inc: c/d/i Ext: no CT, SCDs on  Lab Results  Component Value Date   WBC 11.6* 11/04/2013   HGB 7.8* 11/04/2013   HCT 23.5* 11/04/2013   MCV 84.5 11/04/2013   PLT 145* 11/04/2013    --/--/A POS, A POS (09/04 0220)  A/P Post op day #2 s/p emergency c/s for placental abruption with EBL 2300 S/p 2U PRBCs Continue to work with lactation consults prior to discharge Thrombocytopenia: mild, will recheck CBC in am prior to discharge. Would like to stay add'l day, baby being monitored for weight loss.  Routine care.  Expect d/c 9/7.    April Lyons

## 2013-11-06 ENCOUNTER — Encounter (HOSPITAL_COMMUNITY): Payer: Self-pay | Admitting: *Deleted

## 2013-11-06 LAB — TYPE AND SCREEN
ABO/RH(D): A POS
ANTIBODY SCREEN: NEGATIVE
UNIT DIVISION: 0
UNIT DIVISION: 0
Unit division: 0
Unit division: 0

## 2013-11-06 LAB — MRSA CULTURE

## 2013-11-06 LAB — CBC
HCT: 23.7 % — ABNORMAL LOW (ref 36.0–46.0)
HEMOGLOBIN: 7.6 g/dL — AB (ref 12.0–15.0)
MCH: 28.1 pg (ref 26.0–34.0)
MCHC: 32.1 g/dL (ref 30.0–36.0)
MCV: 87.8 fL (ref 78.0–100.0)
PLATELETS: 190 10*3/uL (ref 150–400)
RBC: 2.7 MIL/uL — ABNORMAL LOW (ref 3.87–5.11)
RDW: 16.9 % — ABNORMAL HIGH (ref 11.5–15.5)
WBC: 8.2 10*3/uL (ref 4.0–10.5)

## 2013-11-06 MED ORDER — FERROUS SULFATE 325 (65 FE) MG PO TABS
325.0000 mg | ORAL_TABLET | Freq: Two times a day (BID) | ORAL | Status: DC
Start: 2013-11-06 — End: 2016-08-03

## 2013-11-06 MED ORDER — OXYCODONE-ACETAMINOPHEN 5-325 MG PO TABS: 1.0000 | ORAL_TABLET | ORAL | Status: DC | PRN

## 2013-11-06 NOTE — Discharge Summary (Signed)
Obstetric Discharge Summary Reason for Admission: Placental abruption. Prenatal Procedures: NST Intrapartum Procedures: cesarean: low cervical, transverse Postpartum Procedures: transfusion 2 units Complications-Operative and Postpartum: none Hemoglobin  Date Value Ref Range Status  11/06/2013 7.6* 12.0 - 15.0 g/dL Final  11/05/2011 11.8* 12.2 - 16.2 g/dL Final     HCT  Date Value Ref Range Status  11/06/2013 23.7* 36.0 - 46.0 % Final     HCT, POC  Date Value Ref Range Status  11/05/2011 38.3  37.7 - 47.9 % Final     Discharge Diagnoses: Term Pregnancy-delivered and Antepartum bleeding  Discharge Information: Date: 11/06/2013 Activity: pelvic rest Diet: routine Medications: Iron and Percocet Condition: stable Instructions: refer to practice specific booklet Discharge to: home Follow-up Information   Follow up with April Charles, MD In 4 weeks.   Specialty:  Obstetrics   Contact information:   [address] [place] [address] [phone]       Newborn Data: Live born female  Birth Weight: 6 lb 7.3 oz (2929 g) APGAR: 8, 9  Home with mother.  April Lyons A 11/06/2013, 8:54 AM

## 2013-11-06 NOTE — Lactation Note (Signed)
This note was copied from the chart of April Maripat Dumont. Lactation Consultation Note  Patient Name: April Lyons [id number] Date: 11/06/2013 Reason for consult: Follow-up assessment Per mom the baby recently fed for 20 mins , and I have heard more swallows. Per mom breast are fuller, nipples are alittle tender initially and once she is latched improve.LC noted full breast bilaterally. LC reviewed sore nipple and engorgement prevention and tx . Per mom has a DEBP at home. Discussed with mom and dad due to weight loss and and moms blood loss , recommended extra pumping for 10 mins both breast 2 times a day and PRN.  Mother informed of post-discharge support and given phone number to the lactation department, including services for phone call assistance; out-patient appointments; and breastfeeding support group. List of other breastfeeding resources in the community given in the handout. Encouraged mother to call for problems or concerns related to breastfeeding.   Maternal Data    Feeding Feeding Type:  (recently fed ) Length of feed: 20 min (per mom )  LATCH Score/Interventions Latch: Grasps breast easily, tongue down, lips flanged, rhythmical sucking.  Audible Swallowing: A few with stimulation  Type of Nipple: Everted at rest and after stimulation  Comfort (Breast/Nipple):  (breast are filling )     Hold (Positioning): No assistance needed to correctly position infant at breast. Intervention(s): Breastfeeding basics reviewed  LATCH Score: 9  Lactation Tools Discussed/Used Pump Review: Milk Storage   Consult Status Consult Status: Complete Date: 11/06/13    April Lyons 11/06/2013, 9:55 AM

## 2013-11-06 NOTE — Lactation Note (Signed)
This note was copied from the chart of April Lyons. Lactation Consultation Note Noted 8% weight loss. When entered rm. Mom feeding baby in cradle position. Discussed weight loss with mom. Mom appeared surprised, stated baby was feeding really good. Mom stated that she has some trouble latching on to Rt. Has good everted nipple, hand expression taught, noted easily expressed rich looking colostrum from Rt. Breast. Baby BF on left. Encouraged mom to keep baby close w/cheek to breast for deeper latch. If baby gets shallow latch may not get as much milk transfer as a deeper latch which could cause weight loss and sore nipples. Mom doesn't feel that's the problem. Encouraged mom to feed at least 20 min or longer, and to occasionally massage breast during feedings to express more colostrum into baby's mouth. Mom was please to see the colostrum. RN aware and in rm.  Patient Name: April Lakeitha Basques ULAGT'X Date: 11/06/2013 Reason for consult: Follow-up assessment;Infant weight loss   Maternal Data    Feeding Feeding Type: Breast Fed Length of feed: 20 min  LATCH Score/Interventions Latch: Grasps breast easily, tongue down, lips flanged, rhythmical sucking. Intervention(s): Breast massage;Breast compression  Audible Swallowing: A few with stimulation Intervention(s): Skin to skin;Hand expression Intervention(s): Alternate breast massage  Type of Nipple: Everted at rest and after stimulation  Comfort (Breast/Nipple): Soft / non-tender     Hold (Positioning): No assistance needed to correctly position infant at breast. Intervention(s): Breastfeeding basics reviewed  LATCH Score: 9  Lactation Tools Discussed/Used     Consult Status Consult Status: Follow-up Date: 11/06/13 Follow-up type: In-patient    Theodoro Kalata 11/06/2013, 5:44 AM

## 2013-11-06 NOTE — Progress Notes (Signed)
  Patient is eating, ambulating, voiding.  Pain control is good.  Working with lactation for feeding.  Filed Vitals:   11/04/13 1745 11/05/13 0600 11/05/13 1818 11/06/13 0542  BP: 106/53 117/76 119/61 119/81  Pulse: 79 76 72 78  Temp: 99.1 F (37.3 C) 98.4 F (36.9 C) 98.5 F (36.9 C) 98.7 F (37.1 C)  TempSrc: Oral  Oral Oral  Resp: 18 18 18 18   Height:      Weight:      SpO2:        lungs:   clear to auscultation cor:    RRR Abdomen:  soft, appropriate tenderness, incisions intact and without erythema or exudate ex:    no cords   Lab Results  Component Value Date   WBC 8.2 11/06/2013   HGB 7.6* 11/06/2013   HCT 23.7* 11/06/2013   MCV 87.8 11/06/2013   PLT 190 11/06/2013    --/--/A POS, A POS (09/04 0220)/RI  A/P    Post operative day 3 from emergent C/S for abruption; s/p 2 units PRBCs.  Platelets and H/H are both stable  today. [place] for D/C.  Routine post op and postpartum care.     Percocet for pain control.

## 2014-01-01 ENCOUNTER — Encounter (HOSPITAL_COMMUNITY): Payer: Self-pay | Admitting: *Deleted

## 2014-01-06 IMAGING — CT CT ABD-PELV W/ CM
2 of 4 series · 17 of 46 positions shown, 19 images · IV contrast (water/omni  & 80ml omni 300)
Comparison: None

CLINICAL DATA: Right lower quadrant pain, nausea, dizziness

CT ABDOMEN AND PELVIS WITH CONTRAST
TECHNIQUE: Multidetector CT imaging of the abdomen and pelvis was
performed following the standard protocol during bolus
administration of intravenous contrast. Sagittal and coronal MPR
images reconstructed from axial data set.
Contrast: 80mL OMNIPAQUE IOHEXOL 300 MG/ML  SOLN Dilute oral
contrast.

[Series 2: routine abdomen · axial · 0.70mm/px · z∈[-508,-83]mm · 14 of 93 slices shown, 16 images]
[im 4/93  soft-tissue]
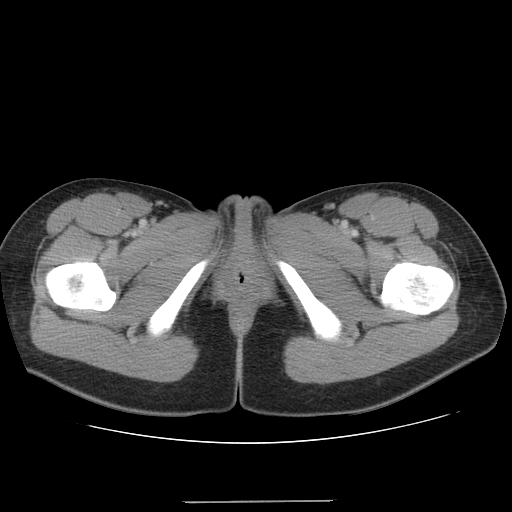
[im 4/93  bone]
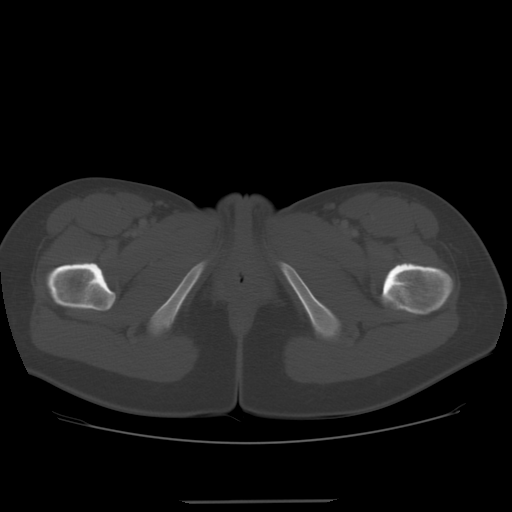
[im 12/93  soft-tissue]
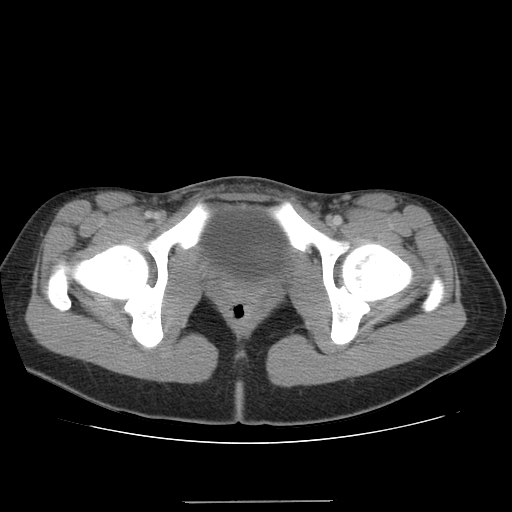
[im 20/93  soft-tissue]
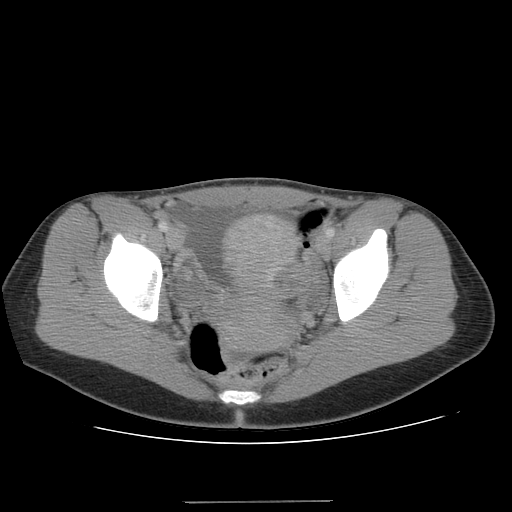
[im 24/93  soft-tissue]
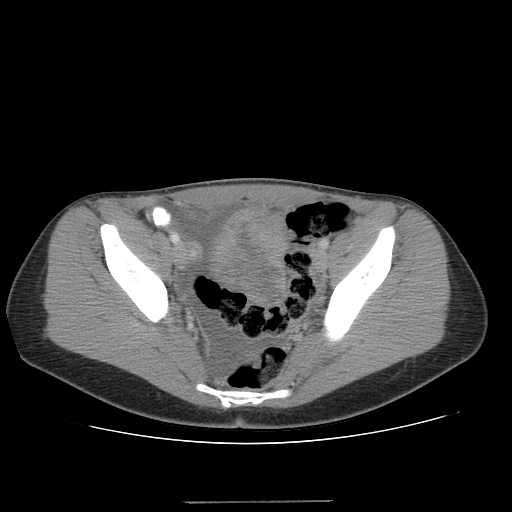
[im 31/93  soft-tissue]
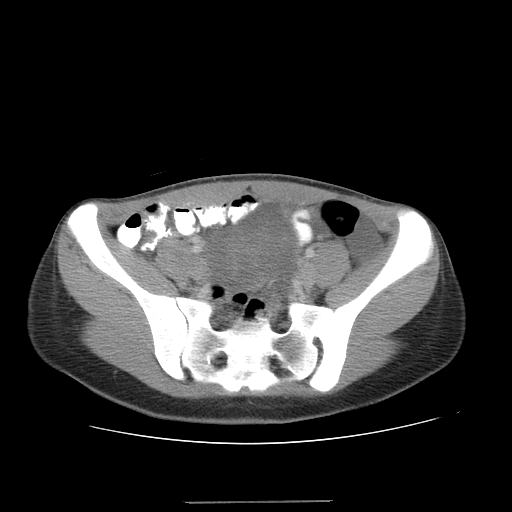
[im 39/93  soft-tissue]
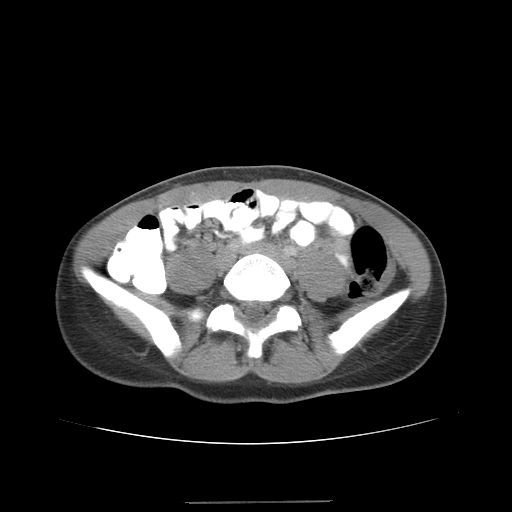
[im 43/93  soft-tissue]
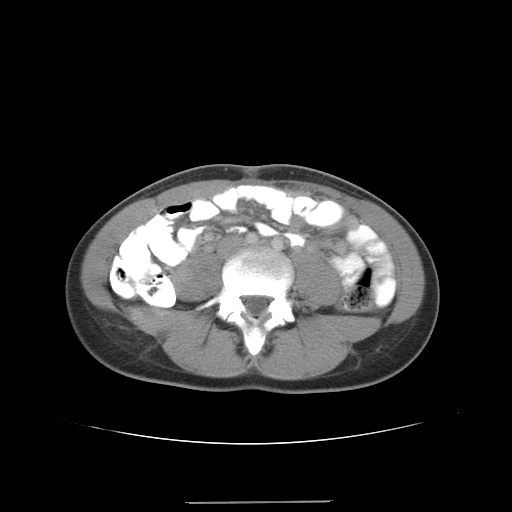
[im 50/93  soft-tissue]
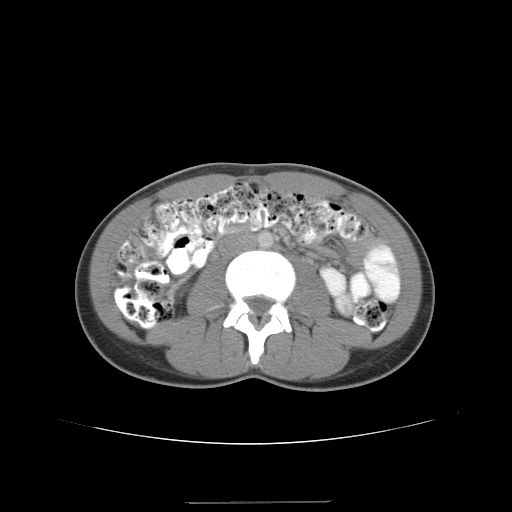
[im 54/93  soft-tissue]
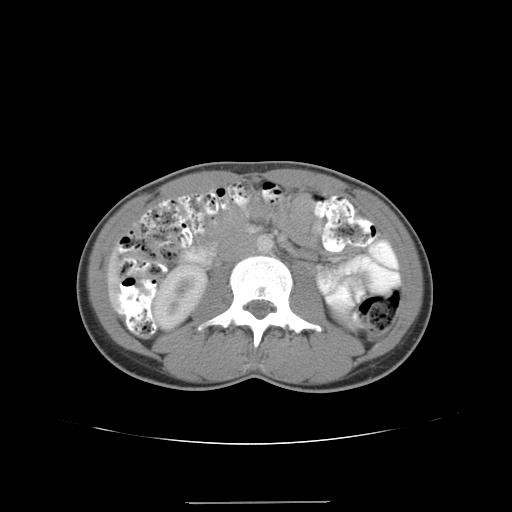
[im 54/93  bone]
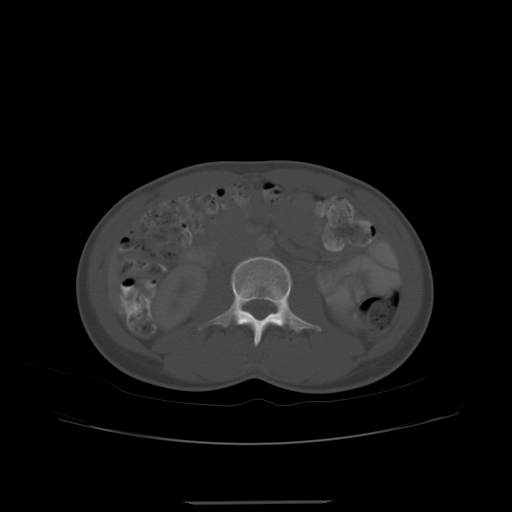
[im 62/93  soft-tissue]
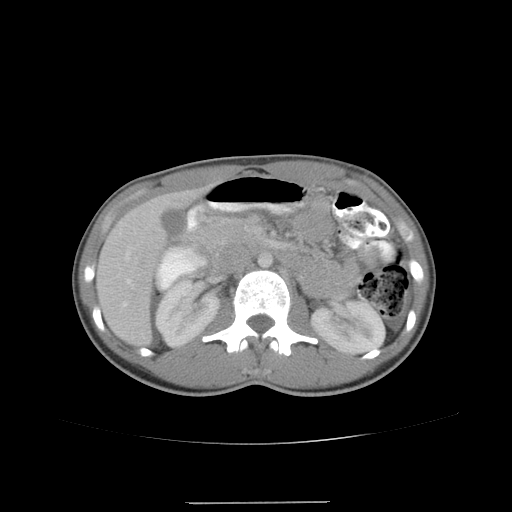
[im 70/93  soft-tissue]
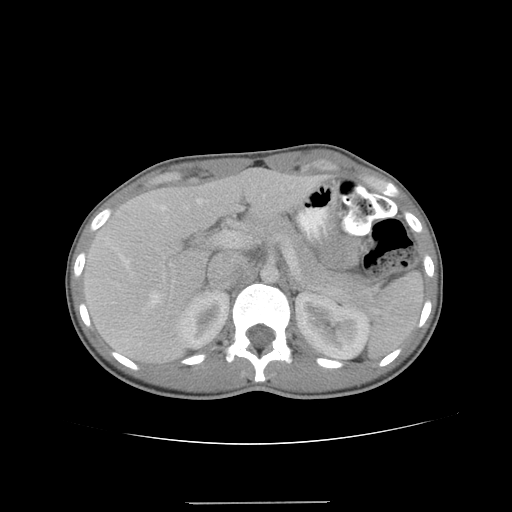
[im 73/93  soft-tissue]
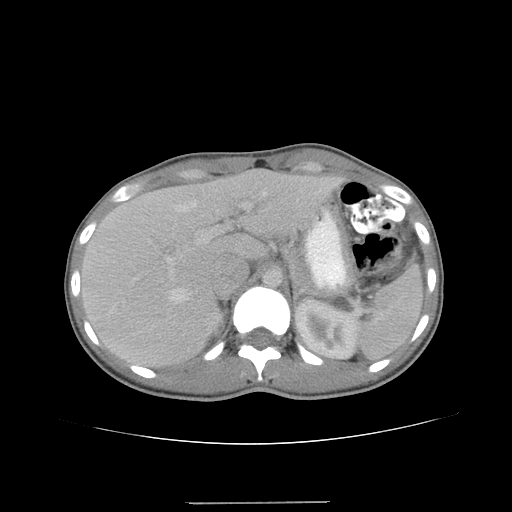
[im 81/93  soft-tissue]
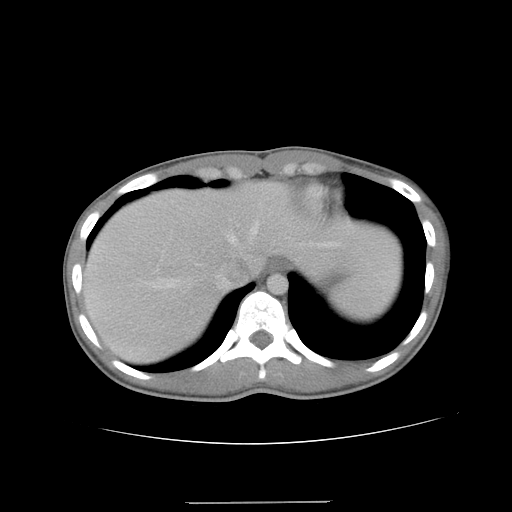
[im 89/93  soft-tissue]
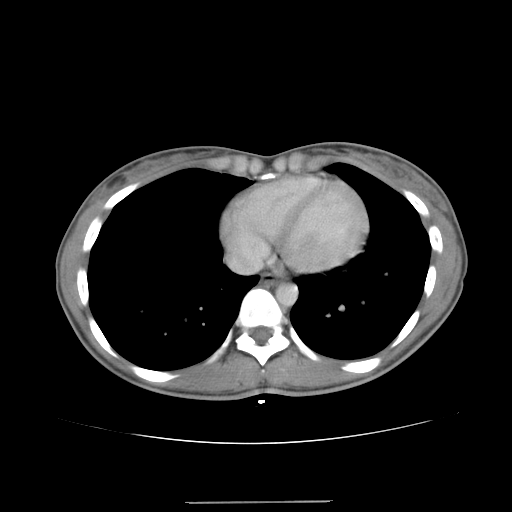

[Series 401: cor · coronal · 0.94mm/px · 3 of 72 slices shown]
[im 24/72  soft-tissue]
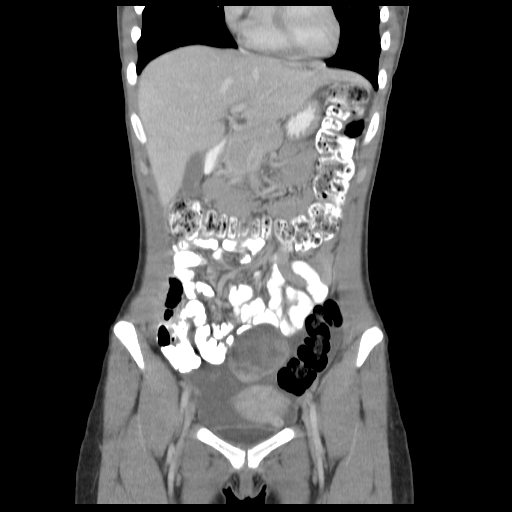
[im 32/72  soft-tissue]
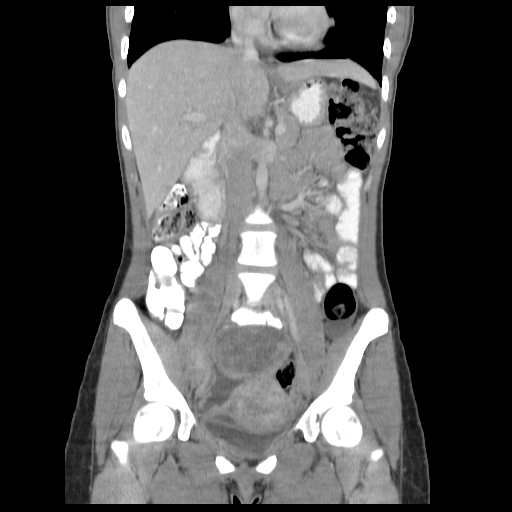
[im 40/72  soft-tissue]
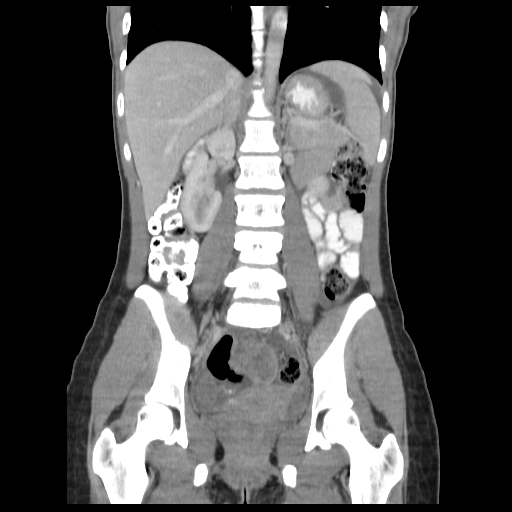

[17 of 46 positions shown; findings below may reference images not displayed]

FINDINGS: Lung bases clear.
Liver, spleen, pancreas, kidneys, and adrenal glands normal
appearance.
Moderate free fluid in pelvis, low attenuation, with additional
small amounts of free fluid identified adjacent to the spleen and
at the paracolic gutters.
Appendix is not well localized.
Questionable visualization of the appendix in the central mid to
upper right pelvis on axial and coronal images.

Large heterogeneous mass identified cranial to the uterus, 7.3 x
7.4 x 5.5 cm in size.
This appears to be contiguous with the superior margin of uterus,
question large exophytic uterine leiomyoma.
There appear to be normal sized ovaries in the adnexae bilaterally,
separate from this mass.
Uterus and bladder otherwise normal morphology.
Large and small bowel loops normal appearance.
No adenopathy or free intraperitoneal air.
No acute osseous findings.
IMPRESSION: Moderate low attenuation free pelvic fluid with additional free
fluid in the pericolic gutters and adjacent to spleen.
A normal appendix is only questionably visualized in the central
pelvis; unable to completely exclude acute appendicitis in this
setting.
Large soft tissue mass 7.3 x 7.4 x 5.5 cm diameter identified
cranial to the uterus, question exophytic leiomyoma though other
masses are not completely excluded; recommend follow-up pelvic
sonography to assess.

## 2016-02-07 ENCOUNTER — Other Ambulatory Visit: Payer: Self-pay | Admitting: Obstetrics & Gynecology

## 2016-02-11 LAB — CYTOLOGY - PAP

## 2016-03-06 LAB — OB RESULTS CONSOLE ABO/RH: RH TYPE: POSITIVE

## 2016-03-06 LAB — OB RESULTS CONSOLE ANTIBODY SCREEN: ANTIBODY SCREEN: NEGATIVE

## 2016-03-06 LAB — OB RESULTS CONSOLE RUBELLA ANTIBODY, IGM: RUBELLA: IMMUNE

## 2016-03-06 LAB — OB RESULTS CONSOLE HEPATITIS B SURFACE ANTIGEN: HEP B S AG: NEGATIVE

## 2016-03-06 LAB — OB RESULTS CONSOLE RPR: RPR: NONREACTIVE

## 2016-03-06 LAB — OB RESULTS CONSOLE HIV ANTIBODY (ROUTINE TESTING): HIV: NONREACTIVE

## 2016-03-06 LAB — OB RESULTS CONSOLE GC/CHLAMYDIA
Chlamydia: NEGATIVE
GC PROBE AMP, GENITAL: NEGATIVE

## 2016-04-11 ENCOUNTER — Inpatient Hospital Stay (HOSPITAL_COMMUNITY)
Admission: AD | Admit: 2016-04-11 | Discharge: 2016-04-11 | Disposition: A | Discharge status: Home / Self Care | Payer: Managed Care, Other (non HMO) | Source: Ambulatory Visit | Attending: Obstetrics and Gynecology | Admitting: Obstetrics and Gynecology

## 2016-04-11 ENCOUNTER — Encounter (HOSPITAL_COMMUNITY): Payer: Self-pay | Admitting: *Deleted

## 2016-04-11 DIAGNOSIS — O26892 Other specified pregnancy related conditions, second trimester: Secondary | ICD-10-CM | POA: Diagnosis not present

## 2016-04-11 DIAGNOSIS — Z3A17 17 weeks gestation of pregnancy: Secondary | ICD-10-CM | POA: Diagnosis not present

## 2016-04-11 DIAGNOSIS — R109 Unspecified abdominal pain: Secondary | ICD-10-CM | POA: Diagnosis not present

## 2016-04-11 LAB — URINALYSIS, ROUTINE W REFLEX MICROSCOPIC
BILIRUBIN URINE: NEGATIVE
Glucose, UA: NEGATIVE mg/dL
KETONES UR: NEGATIVE mg/dL
NITRITE: NEGATIVE
PROTEIN: NEGATIVE mg/dL
SPECIFIC GRAVITY, URINE: 1.005 (ref 1.005–1.030)
pH: 6 (ref 5.0–8.0)

## 2016-04-11 MED ORDER — BUTALBITAL-APAP-CAFFEINE 50-325-40 MG PO TABS: 2.0000 | ORAL_TABLET | Freq: Once | ORAL | Status: DC

## 2016-04-11 NOTE — MAU Provider Note (Signed)
History   G2P1001 @ 17.6 wks in with sharp shooting abd pain since 0200 this morning. Denies ROM or vag bleeding.  CSN: [id number]  Arrival date & time 04/11/16  [id number]   First Provider Initiated Contact with Patient 04/11/16 2027      Chief Complaint  Patient presents with  . Abdominal Pain    HPI  Past Medical History:  Diagnosis Date  . Anemia   . Eczema   . Hypercholesteremia    Fibroids  . Hyperlipidemia     Past Surgical History:  Procedure Laterality Date  . CESAREAN SECTION N/A 11/03/2013   Procedure: CESAREAN SECTION;  Surgeon: Jerelyn Charles, MD;  Location: [facility];  Service: Obstetrics;  Laterality: N/A;    Family History  Problem Relation Age of Onset  . Hypertension Mother   . Asthma Brother   . Cancer Maternal Grandmother     unknown  . Cancer Maternal Grandfather     lung cancer  . Alcohol abuse Paternal Grandfather     Social History  Substance Use Topics  . Smoking status: Never Smoker  . Smokeless tobacco: Never Used  . Alcohol use No    OB History    Gravida Para Term Preterm AB Living   2 1 1     1    SAB TAB Ectopic Multiple Live Births           1      Review of Systems  Constitutional: Negative.   HENT: Negative.   Eyes: Negative.   Respiratory: Negative.   Cardiovascular: Negative.   Gastrointestinal: Positive for abdominal pain.  Endocrine: Negative.   Genitourinary: Negative.   Musculoskeletal: Negative.   Skin: Negative.     Allergies  Patient has no known allergies.  Home Medications    BP 100/63 (BP Location: Right Arm)   Pulse 95   Temp 99 F (37.2 C)   Resp 18   Ht 5\' 10"  (1.778 m)   Wt 126 lb 6.4 oz (57.3 kg)   BMI 18.14 kg/m   Physical Exam  Constitutional: She is oriented to person, place, and time. She appears well-developed and well-nourished.  HENT:  Head: Normocephalic.  Eyes: Pupils are equal, round, and reactive to light.  Neck: Normal range of motion.  Cardiovascular: Normal rate, regular  rhythm, normal heart sounds and intact distal pulses.   Pulmonary/Chest: Effort normal and breath sounds normal.  Abdominal: Soft. Bowel sounds are normal.  Genitourinary: Vagina normal and uterus normal.  Musculoskeletal: Normal range of motion.  Neurological: She is alert and oriented to person, place, and time. She has normal reflexes.  Skin: Skin is warm and dry.  Psychiatric: She has a normal mood and affect. Her behavior is normal. Judgment and thought content normal.    MAU Course  Procedures (including critical care time)  Labs Reviewed  URINALYSIS, ROUTINE W REFLEX MICROSCOPIC - Abnormal; Notable for the following:       Result Value   Hgb urine dipstick SMALL (*)    Leukocytes, UA TRACE (*)    Bacteria, UA RARE (*)    Squamous Epithelial / LPF 0-5 (*)    All other components within normal limits   No results found.   No diagnosis found.    MDM  SVE firm/cl/post/high. FHR pattern st and reg per doppler. POC discussed with Dr. Philis Pique. Pt to be d/c home.

## 2016-04-11 NOTE — MAU Note (Signed)
Abd cramping since 0200 and cont all day. Worse last 2 hours. Denies bleeding or d/c

## 2016-04-11 NOTE — Discharge Instructions (Signed)
Abdominal Pain During Pregnancy °Belly (abdominal) pain is common during pregnancy. Most of the time, it is not a serious problem. Other times, it can be a sign that something is wrong with the pregnancy. Always tell your doctor if you have belly pain. °Follow these instructions at home: °Monitor your belly pain for any changes. The following actions may help you feel better: °· Do not have sex (intercourse) or put anything in your vagina until you feel better. °· Rest until your pain stops. °· Drink clear fluids if you feel sick to your stomach (nauseous). Do not eat solid food until you feel better. °· Only take medicine as told by your doctor. °· Keep all doctor visits as told. °Get help right away if: °· You are bleeding, leaking fluid, or pieces of tissue come out of your vagina. °· You have more pain or cramping. °· You keep throwing up (vomiting). °· You have pain when you pee (urinate) or have blood in your pee. °· You have a fever. °· You do not feel your baby moving as much. °· You feel very weak or feel like passing out. °· You have trouble breathing, with or without belly pain. °· You have a very bad headache and belly pain. °· You have fluid leaking from your vagina and belly pain. °· You keep having watery poop (diarrhea). °· Your belly pain does not go away after resting, or the pain gets worse. °This information is not intended to replace advice given to you by your health care provider. Make sure you discuss any questions you have with your health care provider. °Document Released: 02/04/2009 Document Revised: 09/25/2015 Document Reviewed: 09/15/2012 °Elsevier Interactive Patient Education © 2017 Elsevier Inc. ° °

## 2016-08-03 ENCOUNTER — Inpatient Hospital Stay (HOSPITAL_COMMUNITY): Payer: BLUE CROSS/BLUE SHIELD

## 2016-08-03 ENCOUNTER — Encounter (HOSPITAL_COMMUNITY): Payer: Self-pay | Admitting: *Deleted

## 2016-08-03 ENCOUNTER — Inpatient Hospital Stay (HOSPITAL_COMMUNITY)
Admission: AD | Admit: 2016-08-03 | Discharge: 2016-08-03 | Disposition: A | Discharge status: Home / Self Care | Payer: BLUE CROSS/BLUE SHIELD | Source: Ambulatory Visit | Attending: Obstetrics & Gynecology | Admitting: Obstetrics & Gynecology

## 2016-08-03 DIAGNOSIS — Z3A34 34 weeks gestation of pregnancy: Secondary | ICD-10-CM | POA: Insufficient documentation

## 2016-08-03 DIAGNOSIS — E7800 Pure hypercholesterolemia, unspecified: Secondary | ICD-10-CM | POA: Insufficient documentation

## 2016-08-03 DIAGNOSIS — O9989 Other specified diseases and conditions complicating pregnancy, childbirth and the puerperium: Secondary | ICD-10-CM | POA: Diagnosis not present

## 2016-08-03 DIAGNOSIS — O479 False labor, unspecified: Secondary | ICD-10-CM | POA: Diagnosis present

## 2016-08-03 DIAGNOSIS — O4703 False labor before 37 completed weeks of gestation, third trimester: Secondary | ICD-10-CM | POA: Insufficient documentation

## 2016-08-03 DIAGNOSIS — O3413 Maternal care for benign tumor of corpus uteri, third trimester: Secondary | ICD-10-CM | POA: Diagnosis not present

## 2016-08-03 DIAGNOSIS — D259 Leiomyoma of uterus, unspecified: Secondary | ICD-10-CM | POA: Insufficient documentation

## 2016-08-03 DIAGNOSIS — O99283 Endocrine, nutritional and metabolic diseases complicating pregnancy, third trimester: Secondary | ICD-10-CM | POA: Insufficient documentation

## 2016-08-03 DIAGNOSIS — O4700 False labor before 37 completed weeks of gestation, unspecified trimester: Secondary | ICD-10-CM

## 2016-08-03 DIAGNOSIS — Z3689 Encounter for other specified antenatal screening: Secondary | ICD-10-CM

## 2016-08-03 HISTORY — DX: Premature separation of placenta, unspecified, unspecified trimester: O45.90

## 2016-08-03 HISTORY — DX: Encounter for other specified aftercare: Z51.89

## 2016-08-03 LAB — URINALYSIS, ROUTINE W REFLEX MICROSCOPIC
Bilirubin Urine: NEGATIVE
GLUCOSE, UA: NEGATIVE mg/dL
Hgb urine dipstick: NEGATIVE
Ketones, ur: NEGATIVE mg/dL
LEUKOCYTES UA: NEGATIVE
Nitrite: NEGATIVE
PROTEIN: NEGATIVE mg/dL
Specific Gravity, Urine: 1.013 (ref 1.005–1.030)
pH: 5 (ref 5.0–8.0)

## 2016-08-03 NOTE — MAU Note (Signed)
Sent from [place] office for further eval of mild ucs; denies any vaginal bleeding or leaking;

## 2016-08-03 NOTE — MAU Provider Note (Signed)
[facility] [facility]    Provider Note   CSN: [id number] Arrival date & time: 08/03/16  1733     History   Chief Complaint Chief Complaint  Patient presents with  . Contractions    HPI April Lyons is a 31 y.o. [id number]@ [redacted]w[redacted]d gestation who presents to the ED from the [facility] office for contractions. Dr. Alwyn Pea requested that the patient be placed on the monitor, BPP and cervix checked after 2 hours of monitoring. In the office the baseline was 115, cervix was closed but patient was contracting.    HPI  Past Medical History:  Diagnosis Date  . Anemia   . Blood transfusion without reported diagnosis   . Eczema   . Hypercholesteremia    Fibroids  . Hyperlipidemia   . Placenta abruptio     Patient Active Problem List   Diagnosis Date Noted  . Placental abruption in third trimester 11/03/2013  . Threatened preterm labor, antepartum 08/28/2013  . Uterine fibroid 11/05/2011  . RLQ abdominal pain 11/04/2011    Past Surgical History:  Procedure Laterality Date  . CESAREAN SECTION N/A 11/03/2013   Procedure: CESAREAN SECTION;  Surgeon: Jerelyn Charles, MD;  Location: [facility];  Service: Obstetrics;  Laterality: N/A;    OB History    Gravida Para Term Preterm AB Living   2 1 1     1    SAB TAB Ectopic Multiple Live Births           1       Home Medications    Prior to Admission medications   Medication Sig Start Date End Date Taking? Authorizing Provider  Prenatal Vit-Fe Fumarate-FA (PRENATAL MULTIVITAMIN) TABS tablet Take 1 tablet by mouth daily at 12 noon.   Yes [provider]    Family History Family History  Problem Relation Age of Onset  . Hypertension Mother   . Asthma Brother   . Cancer Maternal Grandmother        unknown  . Cancer Maternal Grandfather        lung cancer  . Alcohol abuse Paternal Grandfather     Social History Social History  Substance Use Topics  . Smoking status: Never Smoker  . Smokeless tobacco: Never Used  . Alcohol use No      Allergies   Patient has no known allergies.   Review of Systems Review of Systems  Constitutional: Negative for fever.  HENT: Negative.   Gastrointestinal: Negative for nausea.  Genitourinary: Negative for dysuria.     Physical Exam Updated Vital Signs BP (!) 111/44 (BP Location: Right Arm)   Pulse 83   Temp 98.3 F (36.8 C)   Resp 18   Ht 5\' 4"  (1.626 m)   Wt 141 lb (64 kg)   BMI 24.20 kg/m   Physical Exam  Constitutional: She is oriented to person, place, and time. She appears well-developed and well-nourished. No distress.  Eyes: EOM are normal.  Neck: Neck supple.  Cardiovascular: Normal rate.   Pulmonary/Chest: Effort normal.  Abdominal: Soft. There is no tenderness.  Musculoskeletal: Normal range of motion.  Neurological: She is alert and oriented to person, place, and time. No cranial nerve deficit.  Skin: Skin is warm and dry.  Psychiatric: She has a normal mood and affect.  Nursing note and vitals reviewed.   Fetal Tracing:  Baseline: 125 Variability: moderate Accelerations: 15x15 Decelerations:  none  Toco: irr ui ED Treatments / Results  Labs (all labs ordered are listed, but only  abnormal results are displayed) Labs Reviewed  URINALYSIS, [place]    Radiology No results found.  Procedures Procedures (including critical care time)  Medications Ordered in ED Medications - No data to display   Initial Impression / Assessment and Plan / ED Course  I have reviewed the triage vital signs and the nursing notes. New Prescriptions Current Discharge Medication List     Care turned over to Jorje Guild, NP @ 8 pm. BPP results pending.    -BPP 8/8 with normal AFI -Reactive NST with intermittent UI -Cervix remains closed -Discussed FHT, SVE, & BPP with Dr. Alwyn Pea. Ok to discharge home with PTL precautions & kick counts  A: 1. NST (non-stress test) reactive   2. Preterm contractions   3. Braxton Hick's  contraction    P: Discharge home PTL precautions & fetal kick count form Keep scheduled appointment with Dr. Alwyn Pea next week or call as needed  Jorje Guild, NP

## 2016-08-03 NOTE — MAU Note (Signed)
Pt presents to MAU from Dr Tyler Aas office to rule out labor. Pt was checked by Dr Alwyn Pea in the office today and states her cervix was closed. Denies any VB or LOF.

## 2016-08-03 NOTE — Discharge Instructions (Signed)
Braxton Hicks Contractions °Contractions of the uterus can occur throughout pregnancy, but they are not always a sign that you are in labor. You may have practice contractions called Braxton Hicks contractions. These false labor contractions are sometimes confused with true labor. °What are Braxton Hicks contractions? °Braxton Hicks contractions are tightening movements that occur in the muscles of the uterus before labor. Unlike true labor contractions, these contractions do not result in opening (dilation) and thinning of the cervix. Toward the end of pregnancy (32-34 weeks), Braxton Hicks contractions can happen more often and may become stronger. These contractions are sometimes difficult to tell apart from true labor because they can be very uncomfortable. You should not feel embarrassed if you go to the hospital with false labor. °Sometimes, the only way to tell if you are in true labor is for your health care provider to look for changes in the cervix. The health care provider will do a physical exam and may monitor your contractions. If you are not in true labor, the exam should show that your cervix is not dilating and your water has not broken. °If there are no prenatal problems or other health problems associated with your pregnancy, it is completely safe for you to be sent home with false labor. You may continue to have Braxton Hicks contractions until you go into true labor. °How can I tell the difference between true labor and false labor? °· Differences °? False labor °? Contractions last 30-70 seconds.: Contractions are usually shorter and not as strong as true labor contractions. °? Contractions become very regular.: Contractions are usually irregular. °? Discomfort is usually felt in the top of the uterus, and it spreads to the lower abdomen and low back.: Contractions are often felt in the front of the lower abdomen and in the groin. °? Contractions do not go away with walking.: Contractions may  go away when you walk around or change positions while lying down. °? Contractions usually become more intense and increase in frequency.: Contractions get weaker and are shorter-lasting as time goes on. °? The cervix dilates and gets thinner.: The cervix usually does not dilate or become thin. °Follow these instructions at home: °· Take over-the-counter and prescription medicines only as told by your health care provider. °· Keep up with your usual exercises and follow other instructions from your health care provider. °· Eat and drink lightly if you think you are going into labor. °· If Braxton Hicks contractions are making you uncomfortable: °? Change your position from lying down or resting to walking, or change from walking to resting. °? Sit and rest in a tub of warm water. °? Drink enough fluid to keep your urine clear or pale yellow. Dehydration may cause these contractions. °? Do slow and deep breathing several times an hour. °· Keep all follow-up prenatal visits as told by your health care provider. This is important. °Contact a health care provider if: °· You have a fever. °· You have continuous pain in your abdomen. °Get help right away if: °· Your contractions become stronger, more regular, and closer together. °· You have fluid leaking or gushing from your vagina. °· You pass blood-tinged mucus (bloody show). °· You have bleeding from your vagina. °· You have low back pain that you never had before. °· You feel your baby’s head pushing down and causing pelvic pressure. °· Your baby is not moving inside you as much as it used to. °Summary °· Contractions that occur before labor are   called Braxton Hicks contractions, false labor, or practice contractions. °· Braxton Hicks contractions are usually shorter, weaker, farther apart, and less regular than true labor contractions. True labor contractions usually become progressively stronger and regular and they become more frequent. °· Manage discomfort from  Braxton Hicks contractions by changing position, resting in a warm bath, drinking plenty of water, or practicing deep breathing. °This information is not intended to replace advice given to you by your health care provider. Make sure you discuss any questions you have with your health care provider. °Document Released: 02/16/2005 Document Revised: 01/06/2016 Document Reviewed: 01/06/2016 °Elsevier Interactive Patient Education © 2017 Elsevier Inc. ° ° °Fetal Movement Counts °Patient Name: ________________________________________________ Patient Due Date: ____________________ °What is a fetal movement count? °A fetal movement count is the number of times that you feel your baby move during a certain amount of time. This may also be called a fetal kick count. A fetal movement count is recommended for every pregnant woman. You may be asked to start counting fetal movements as early as week 28 of your pregnancy. °Pay attention to when your baby is most active. You may notice your baby's sleep and wake cycles. You may also notice things that make your baby move more. You should do a fetal movement count: °· When your baby is normally most active. °· At the same time each day. ° °A good time to count movements is while you are resting, after having something to eat and drink. °How do I count fetal movements? °1. Find a quiet, comfortable area. Sit, or lie down on your side. °2. Write down the date, the start time and stop time, and the number of movements that you felt between those two times. Take this information with you to your health care visits. °3. For 2 hours, count kicks, flutters, swishes, rolls, and jabs. You should feel at least 10 movements during 2 hours. °4. You may stop counting after you have felt 10 movements. °5. If you do not feel 10 movements in 2 hours, have something to eat and drink. Then, keep resting and counting for 1 hour. If you feel at least 4 movements during that hour, you may stop  counting. °Contact a health care provider if: °· You feel fewer than 4 movements in 2 hours. °· Your baby is not moving like he or she usually does. °Date: ____________ Start time: ____________ Stop time: ____________ Movements: ____________ °Date: ____________ Start time: ____________ Stop time: ____________ Movements: ____________ °Date: ____________ Start time: ____________ Stop time: ____________ Movements: ____________ °Date: ____________ Start time: ____________ Stop time: ____________ Movements: ____________ °Date: ____________ Start time: ____________ Stop time: ____________ Movements: ____________ °Date: ____________ Start time: ____________ Stop time: ____________ Movements: ____________ °Date: ____________ Start time: ____________ Stop time: ____________ Movements: ____________ °Date: ____________ Start time: ____________ Stop time: ____________ Movements: ____________ °Date: ____________ Start time: ____________ Stop time: ____________ Movements: ____________ °This information is not intended to replace advice given to you by your health care provider. Make sure you discuss any questions you have with your health care provider. °Document Released: 03/18/2006 Document Revised: 10/16/2015 Document Reviewed: 03/28/2015 °Elsevier Interactive Patient Education © 2018 Elsevier Inc. ° °

## 2016-08-07 ENCOUNTER — Other Ambulatory Visit: Payer: Self-pay | Admitting: Obstetrics & Gynecology

## 2016-08-12 ENCOUNTER — Other Ambulatory Visit: Payer: Self-pay | Admitting: Obstetrics & Gynecology

## 2016-08-12 LAB — OB RESULTS CONSOLE GBS: GBS: NEGATIVE

## 2016-08-16 ENCOUNTER — Encounter (HOSPITAL_COMMUNITY): Payer: Self-pay | Admitting: Certified Nurse Midwife

## 2016-08-16 ENCOUNTER — Inpatient Hospital Stay (HOSPITAL_COMMUNITY)
Admission: AD | Admit: 2016-08-16 | Discharge: 2016-08-16 | Disposition: A | Discharge status: Home / Self Care | Payer: BLUE CROSS/BLUE SHIELD | Source: Ambulatory Visit | Attending: Obstetrics & Gynecology | Admitting: Obstetrics & Gynecology

## 2016-08-16 DIAGNOSIS — Z3A36 36 weeks gestation of pregnancy: Secondary | ICD-10-CM | POA: Diagnosis not present

## 2016-08-16 DIAGNOSIS — Z3483 Encounter for supervision of other normal pregnancy, third trimester: Secondary | ICD-10-CM | POA: Diagnosis present

## 2016-08-16 DIAGNOSIS — O479 False labor, unspecified: Secondary | ICD-10-CM

## 2016-08-16 LAB — POCT FERN TEST: POCT Fern Test: NEGATIVE

## 2016-08-16 LAB — AMNISURE RUPTURE OF MEMBRANE (ROM) NOT AT ARMC: AMNISURE: NEGATIVE

## 2016-08-16 NOTE — Discharge Instructions (Signed)

## 2016-08-16 NOTE — MAU Note (Signed)
Patient presents to MAU with c/o rupture of membranes. Patient states her water broke at 2130 and was a "big gush of fluid but nothing since". Denies contractions currently. Denies VB. +FM.

## 2016-08-16 NOTE — MAU Provider Note (Signed)
S: April Lyons is a 31 y.o. G2P1001 at [redacted]w[redacted]d who presents today with leaking of fluid. She states that around 2130 she had a large gush of clear fluid. She has not had any leaking since. She denies any VB. She confirms fetal movement.  O: VSS, afebrile Abdomen: soft, non-tender, gravid External: no lesion Vagina: small amount of white discharge. No pooling  Cervix: pink, smooth, no fluid seen with valsalva. 1/thick/-3  Uterus: AGA FHT: 145, moderate with 15x15 accels, no decels Toco: about every 3-7 mins FERN: NEGATIVE   A/P: Exam for ROM RN will report to attending MD Marcille Buffy 10:41 PM 08/16/16

## 2016-08-21 ENCOUNTER — Encounter (HOSPITAL_COMMUNITY): Payer: Self-pay

## 2016-09-01 NOTE — Patient Instructions (Signed)
[place]  09/01/2016   Your procedure is scheduled on:  09/06/2016  Enter through the Main Entrance of [facility] up the phone at the desk and dial [phone].   Call this number if you have problems the morning of surgery: [phone]   Remember:   Do not eat food:After Midnight.  Do not drink clear liquids: After Midnight.  Take these medicines the morning of surgery with A SIP OF WATER: none   Do not wear jewelry, make-up or nail polish.  Do not wear lotions, powders, or perfumes. Do not wear deodorant.  Do not shave 48 hours prior to surgery.  Do not bring valuables to the hospital.  [facility] is not   responsible for any belongings or valuables brought to the hospital.  Contacts, dentures or bridgework may not be worn into surgery.  Leave suitcase in the car. After surgery it may be brought to your room.  For patients admitted to the hospital, checkout time is 11:00 AM the day of              discharge.   Patients discharged the day of surgery will not be allowed to drive             home.  Name and phone number of your driver: na  Special Instructions:   N/A   Please read over the following fact sheets that you were given:   Surgical Site Infection Prevention

## 2016-09-04 ENCOUNTER — Encounter (HOSPITAL_COMMUNITY): Payer: Self-pay

## 2016-09-04 ENCOUNTER — Encounter (HOSPITAL_COMMUNITY): Payer: Self-pay | Admitting: *Deleted

## 2016-09-04 ENCOUNTER — Encounter (HOSPITAL_COMMUNITY)
Admission: RE | Admit: 2016-09-04 | Discharge: 2016-09-04 | Disposition: A | Discharge status: Home / Self Care | Payer: BLUE CROSS/BLUE SHIELD | Source: Ambulatory Visit | Attending: Obstetrics & Gynecology | Admitting: Obstetrics & Gynecology

## 2016-09-04 HISTORY — DX: Benign neoplasm of connective and other soft tissue, unspecified: D21.9

## 2016-09-04 HISTORY — DX: Personal history of other infectious and parasitic diseases: Z86.19

## 2016-09-04 HISTORY — DX: Personal history of other complications of pregnancy, childbirth and the puerperium: Z87.59

## 2016-09-04 LAB — CBC
HEMATOCRIT: 31.4 % — AB (ref 36.0–46.0)
Hemoglobin: 9.8 g/dL — ABNORMAL LOW (ref 12.0–15.0)
MCH: 23.4 pg — ABNORMAL LOW (ref 26.0–34.0)
MCHC: 31.2 g/dL (ref 30.0–36.0)
MCV: 75.1 fL — ABNORMAL LOW (ref 78.0–100.0)
Platelets: 282 10*3/uL (ref 150–400)
RBC: 4.18 MIL/uL (ref 3.87–5.11)
RDW: 17.4 % — AB (ref 11.5–15.5)
WBC: 5.8 10*3/uL (ref 4.0–10.5)

## 2016-09-04 LAB — TYPE AND SCREEN
ABO/RH(D): A POS
ANTIBODY SCREEN: NEGATIVE

## 2016-09-05 LAB — RPR: RPR Ser Ql: NONREACTIVE

## 2016-09-06 ENCOUNTER — Encounter (HOSPITAL_COMMUNITY): Payer: Self-pay | Admitting: General Practice

## 2016-09-06 ENCOUNTER — Inpatient Hospital Stay (HOSPITAL_COMMUNITY)
Admission: RE | Admit: 2016-09-06 | Discharge: 2016-09-09 | DRG: 766 | Disposition: A | Discharge status: Home / Self Care | Payer: BLUE CROSS/BLUE SHIELD | Source: Ambulatory Visit | Attending: Obstetrics & Gynecology | Admitting: Obstetrics & Gynecology

## 2016-09-06 ENCOUNTER — Inpatient Hospital Stay (HOSPITAL_COMMUNITY): Payer: BLUE CROSS/BLUE SHIELD | Admitting: Anesthesiology

## 2016-09-06 ENCOUNTER — Encounter (HOSPITAL_COMMUNITY)
Admission: RE | Disposition: A | Discharge status: Home / Self Care | Payer: Self-pay | Source: Ambulatory Visit | Attending: Obstetrics & Gynecology

## 2016-09-06 DIAGNOSIS — Z3A39 39 weeks gestation of pregnancy: Secondary | ICD-10-CM | POA: Diagnosis not present

## 2016-09-06 DIAGNOSIS — D649 Anemia, unspecified: Secondary | ICD-10-CM | POA: Diagnosis present

## 2016-09-06 DIAGNOSIS — O34211 Maternal care for low transverse scar from previous cesarean delivery: Secondary | ICD-10-CM | POA: Diagnosis present

## 2016-09-06 DIAGNOSIS — Z98891 History of uterine scar from previous surgery: Secondary | ICD-10-CM

## 2016-09-06 DIAGNOSIS — O9902 Anemia complicating childbirth: Secondary | ICD-10-CM | POA: Diagnosis present

## 2016-09-06 SURGERY — Surgical Case
Anesthesia: Spinal | Site: Abdomen | Wound class: Clean Contaminated

## 2016-09-06 MED ORDER — TETANUS-DIPHTH-ACELL PERTUSSIS 5-2.5-18.5 LF-MCG/0.5 IM SUSP: 0.5000 mL | Freq: Once | INTRAMUSCULAR | Status: DC

## 2016-09-06 MED ORDER — CEFAZOLIN SODIUM-DEXTROSE 2-4 GM/100ML-% IV SOLN
2.0000 g | INTRAVENOUS | Status: AC
  Administered 2016-09-06: 2 g via INTRAVENOUS
  Filled 2016-09-06: qty 100

## 2016-09-06 MED ORDER — COCONUT OIL OIL: 1.0000 "application " | TOPICAL_OIL | Status: DC | PRN

## 2016-09-06 MED ORDER — PHENYLEPHRINE 8 MG IN D5W 100 ML (0.08MG/ML) PREMIX OPTIME
INJECTION | INTRAVENOUS | Status: DC | PRN
  Administered 2016-09-06: 60 ug/min via INTRAVENOUS

## 2016-09-06 MED ORDER — OXYCODONE HCL 5 MG PO TABS: 5.0000 mg | ORAL_TABLET | Freq: Once | ORAL | Status: DC | PRN

## 2016-09-06 MED ORDER — OXYTOCIN 10 UNIT/ML IJ SOLN
INTRAVENOUS | Status: DC | PRN
  Administered 2016-09-06: 40 [IU] via INTRAVENOUS

## 2016-09-06 MED ORDER — SCOPOLAMINE 1 MG/3DAYS TD PT72SCOPOLAMINE 1 MG/3DAYS
MEDICATED_PATCH | TRANSDERMAL | Status: DC | PRN
Start: 2016-09-06 — End: 2016-09-06
  Administered 2016-09-06: 1 via TRANSDERMAL

## 2016-09-06 MED ORDER — BUPIVACAINE HCL 0.25 % IJ SOLN
INTRAMUSCULAR | Status: DC | PRN
  Administered 2016-09-06: 25 mL

## 2016-09-06 MED ORDER — DIPHENHYDRAMINE HCL 25 MG PO CAPS: 25.0000 mg | ORAL_CAPSULE | Freq: Four times a day (QID) | ORAL | Status: DC | PRN

## 2016-09-06 MED ORDER — MORPHINE SULFATE (PF) 0.5 MG/ML IJ SOLN
INTRAMUSCULAR | Status: AC
  Filled 2016-09-06: qty 10

## 2016-09-06 MED ORDER — OXYCODONE-ACETAMINOPHEN 5-325 MG PO TABS
2.0000 | ORAL_TABLET | ORAL | Status: DC | PRN
Start: 2016-09-06 — End: 2016-09-09

## 2016-09-06 MED ORDER — PRENATAL MULTIVITAMIN CH
1.0000 | ORAL_TABLET | Freq: Every day | ORAL | Status: DC
  Administered 2016-09-07 – 2016-09-09 (×3): 1 via ORAL
  Filled 2016-09-06 (×3): qty 1

## 2016-09-06 MED ORDER — ONDANSETRON HCL 4 MG/2ML IJ SOLN
INTRAMUSCULAR | Status: DC | PRN
  Administered 2016-09-06: 4 mg via INTRAVENOUS

## 2016-09-06 MED ORDER — LACTATED RINGERS IV SOLN
INTRAVENOUS | Status: DC | PRN
  Administered 2016-09-06: 08:00:00 via INTRAVENOUS

## 2016-09-06 MED ORDER — BUPIVACAINE IN DEXTROSE 0.75-8.25 % IT SOLN
INTRATHECAL | Status: DC | PRN
  Administered 2016-09-06: 1.6 mL via INTRATHECAL

## 2016-09-06 MED ORDER — OXYCODONE HCL 5 MG/5ML PO SOLN: 5.0000 mg | Freq: Once | ORAL | Status: DC | PRN

## 2016-09-06 MED ORDER — SIMETHICONE 80 MG PO CHEW: 80.0000 mg | CHEWABLE_TABLET | ORAL | Status: DC | PRN

## 2016-09-06 MED ORDER — OXYTOCIN 40 UNITS IN LACTATED RINGERS INFUSION - SIMPLE MED: 2.5000 [IU]/h | INTRAVENOUS | Status: AC

## 2016-09-06 MED ORDER — WITCH HAZEL-GLYCERIN EX PADS: 1.0000 "application " | MEDICATED_PAD | CUTANEOUS | Status: DC | PRN

## 2016-09-06 MED ORDER — SIMETHICONE 80 MG PO CHEW
80.0000 mg | CHEWABLE_TABLET | Freq: Three times a day (TID) | ORAL | Status: DC
  Administered 2016-09-07 – 2016-09-09 (×6): 80 mg via ORAL
  Filled 2016-09-06 (×5): qty 1

## 2016-09-06 MED ORDER — ACETAMINOPHEN 325 MG PO TABS
650.0000 mg | ORAL_TABLET | ORAL | Status: DC | PRN
Start: 2016-09-06 — End: 2016-09-09
  Administered 2016-09-06 – 2016-09-07 (×2): 650 mg via ORAL
  Filled 2016-09-06 (×2): qty 2

## 2016-09-06 MED ORDER — ONDANSETRON HCL 4 MG/2ML IJ SOLN
INTRAMUSCULAR | Status: AC
  Filled 2016-09-06: qty 2

## 2016-09-06 MED ORDER — LACTATED RINGERS IV SOLN
INTRAVENOUS | Status: DC
  Administered 2016-09-06 (×3): via INTRAVENOUS

## 2016-09-06 MED ORDER — ZOLPIDEM TARTRATE 5 MG PO TABS: 5.0000 mg | ORAL_TABLET | Freq: Every evening | ORAL | Status: DC | PRN

## 2016-09-06 MED ORDER — OXYTOCIN 10 UNIT/ML IJ SOLN
INTRAMUSCULAR | Status: AC
  Filled 2016-09-06: qty 4

## 2016-09-06 MED ORDER — PHENYLEPHRINE 8 MG IN D5W 100 ML (0.08MG/ML) PREMIX OPTIME
INJECTION | INTRAVENOUS | Status: AC
  Filled 2016-09-06: qty 100

## 2016-09-06 MED ORDER — MENTHOL 3 MG MT LOZG: 1.0000 | LOZENGE | OROMUCOSAL | Status: DC | PRN

## 2016-09-06 MED ORDER — SOD CITRATE-CITRIC ACID 500-334 MG/5ML PO SOLN
30.0000 mL | Freq: Once | ORAL | Status: AC
  Administered 2016-09-06: 30 mL via ORAL
  Filled 2016-09-06: qty 15

## 2016-09-06 MED ORDER — MORPHINE SULFATE (PF) 0.5 MG/ML IJ SOLN
INTRAMUSCULAR | Status: DC | PRN
  Administered 2016-09-06: .2 mg via INTRATHECAL

## 2016-09-06 MED ORDER — FENTANYL CITRATE (PF) 100 MCG/2ML IJ SOLN
INTRAMUSCULAR | Status: AC
  Filled 2016-09-06: qty 2

## 2016-09-06 MED ORDER — SENNOSIDES-DOCUSATE SODIUM 8.6-50 MG PO TABS
2.0000 | ORAL_TABLET | ORAL | Status: DC
  Administered 2016-09-07 – 2016-09-08 (×3): 2 via ORAL
  Filled 2016-09-06 (×2): qty 2

## 2016-09-06 MED ORDER — IBUPROFEN 600 MG PO TABS
600.0000 mg | ORAL_TABLET | Freq: Four times a day (QID) | ORAL | Status: DC
  Administered 2016-09-07 – 2016-09-09 (×10): 600 mg via ORAL
  Filled 2016-09-06 (×10): qty 1

## 2016-09-06 MED ORDER — PROMETHAZINE HCL 25 MG/ML IJ SOLN: 6.2500 mg | INTRAMUSCULAR | Status: DC | PRN

## 2016-09-06 MED ORDER — SIMETHICONE 80 MG PO CHEW
80.0000 mg | CHEWABLE_TABLET | ORAL | Status: DC
  Administered 2016-09-07 – 2016-09-08 (×3): 80 mg via ORAL
  Filled 2016-09-06 (×2): qty 1

## 2016-09-06 MED ORDER — FENTANYL CITRATE (PF) 100 MCG/2ML IJ SOLN
INTRAMUSCULAR | Status: DC | PRN
  Administered 2016-09-06: 10 ug via INTRATHECAL

## 2016-09-06 MED ORDER — LACTATED RINGERS IV SOLN
INTRAVENOUS | Status: DC
  Administered 2016-09-06: 16:00:00 via INTRAVENOUS
  Administered 2016-09-07: 125 mL/h via INTRAVENOUS

## 2016-09-06 MED ORDER — KETOROLAC TROMETHAMINE 30 MG/ML IJ SOLN
30.0000 mg | Freq: Once | INTRAMUSCULAR | Status: AC
  Administered 2016-09-06: 30 mg via INTRAVENOUS
  Filled 2016-09-06: qty 1

## 2016-09-06 MED ORDER — OXYCODONE-ACETAMINOPHEN 5-325 MG PO TABS
1.0000 | ORAL_TABLET | ORAL | Status: DC | PRN
  Administered 2016-09-08: 1 via ORAL
  Filled 2016-09-06: qty 1

## 2016-09-06 MED ORDER — SCOPOLAMINE 1 MG/3DAYS TD PT72
MEDICATED_PATCH | TRANSDERMAL | Status: AC
  Filled 2016-09-06: qty 1

## 2016-09-06 MED ORDER — HYDROMORPHONE HCL 1 MG/ML IJ SOLN: 0.2500 mg | INTRAMUSCULAR | Status: DC | PRN

## 2016-09-06 MED ORDER — BUPIVACAINE IN DEXTROSE 0.75-8.25 % IT SOLN
INTRATHECAL | Status: AC
  Filled 2016-09-06: qty 2

## 2016-09-06 MED ORDER — DIBUCAINE 1 % RE OINT: 1.0000 "application " | TOPICAL_OINTMENT | RECTAL | Status: DC | PRN

## 2016-09-06 MED ORDER — BUPIVACAINE HCL (PF) 0.25 % IJ SOLN
INTRAMUSCULAR | Status: AC
  Filled 2016-09-06: qty 30

## 2016-09-06 SURGICAL SUPPLY — 42 items
BARRIER ADHS 3X4 INTERCEED (GAUZE/BANDAGES/DRESSINGS) ×3 IMPLANT
BENZOIN TINCTURE PRP APPL 2/3 (GAUZE/BANDAGES/DRESSINGS) ×3 IMPLANT
CHLORAPREP W/TINT 26ML (MISCELLANEOUS) ×3 IMPLANT
CLAMP CORD UMBIL (MISCELLANEOUS) IMPLANT
CLOSURE WOUND 1/2 X4 (GAUZE/BANDAGES/DRESSINGS) ×1
CLOTH BEACON ORANGE TIMEOUT ST (SAFETY) ×3 IMPLANT
DRSG OPSITE POSTOP 4X10 (GAUZE/BANDAGES/DRESSINGS) ×3 IMPLANT
ELECT REM PT RETURN 9FT ADLT (ELECTROSURGICAL) ×3
ELECTRODE REM PT RTRN 9FT ADLT (ELECTROSURGICAL) ×1 IMPLANT
EXTRACTOR VACUUM BELL STYLE (SUCTIONS) IMPLANT
EXTRACTOR VACUUM KIWI (MISCELLANEOUS) ×3 IMPLANT
GLOVE BIO SURGEON STRL SZ 6.5 (GLOVE) ×2 IMPLANT
GLOVE BIO SURGEONS STRL SZ 6.5 (GLOVE) ×1
GLOVE BIOGEL PI IND STRL 7.0 (GLOVE) ×2 IMPLANT
GLOVE BIOGEL PI INDICATOR 7.0 (GLOVE) ×4
GOWN STRL REUS W/TWL LRG LVL3 (GOWN DISPOSABLE) ×9 IMPLANT
KIT ABG SYR 3ML LUER SLIP (SYRINGE) IMPLANT
NEEDLE HYPO 22GX1.5 SAFETY (NEEDLE) IMPLANT
NEEDLE HYPO 25X5/8 SAFETYGLIDE (NEEDLE) IMPLANT
NS IRRIG 1000ML POUR BTL (IV SOLUTION) ×3 IMPLANT
PACK C SECTION WH (CUSTOM PROCEDURE TRAY) ×3 IMPLANT
PAD ABD 7.5X8 STRL (GAUZE/BANDAGES/DRESSINGS) ×3 IMPLANT
PAD ABD 8X7 1/2 STERILE (GAUZE/BANDAGES/DRESSINGS) ×3 IMPLANT
PAD OB MATERNITY 4.3X12.25 (PERSONAL CARE ITEMS) ×3 IMPLANT
PENCIL SMOKE EVAC W/HOLSTER (ELECTROSURGICAL) ×3 IMPLANT
RTRCTR C-SECT PINK 25CM LRG (MISCELLANEOUS) ×3 IMPLANT
SPONGE GAUZE 4X4 12PLY STER LF (GAUZE/BANDAGES/DRESSINGS) ×3 IMPLANT
SPONGE GAUZE 4X4 FOR O.R. (GAUZE/BANDAGES/DRESSINGS) ×3 IMPLANT
STRIP CLOSURE SKIN 1/2X4 (GAUZE/BANDAGES/DRESSINGS) ×2 IMPLANT
SUT CHROMIC 2 0 CT 1 (SUTURE) ×6 IMPLANT
SUT MNCRL 0 VIOLET CTX 36 (SUTURE) ×2 IMPLANT
SUT MONOCRYL 0 CTX 36 (SUTURE) ×4
SUT PDS AB 0 CTX 36 PDP370T (SUTURE) IMPLANT
SUT PLAIN 2 0 (SUTURE)
SUT PLAIN ABS 2-0 CT1 27XMFL (SUTURE) IMPLANT
SUT VIC AB 0 CTX 36 (SUTURE) ×4
SUT VIC AB 0 CTX36XBRD ANBCTRL (SUTURE) ×2 IMPLANT
SUT VIC AB 4-0 KS 27 (SUTURE) ×3 IMPLANT
SYR CONTROL 10ML LL (SYRINGE) IMPLANT
TAPE MEDIFIX FOAM 3 (GAUZE/BANDAGES/DRESSINGS) ×3 IMPLANT
TOWEL OR 17X24 6PK STRL BLUE (TOWEL DISPOSABLE) ×3 IMPLANT
TRAY FOLEY BAG SILVER LF 14FR (SET/KITS/TRAYS/PACK) ×3 IMPLANT

## 2016-09-06 NOTE — H&P (Signed)
April Lyons is a 31 y.o. female presenting for elective repeat cesarean section. She is having irregular mild contractions, no LOF, no vaginal bleeding and notes Good fetal movement.   OB History    Gravida Para Term Preterm AB Living   2 1 1     1    SAB TAB Ectopic Multiple Live Births           1     Past Medical History:  Diagnosis Date  . Anemia   . Blood transfusion without reported diagnosis   . Eczema   . Fibroid   . History of molluscum contagiosum   . History of placenta abruption   . Hypercholesteremia    Fibroids  . Hyperlipidemia   . Placenta abruptio    Past Surgical History:  Procedure Laterality Date  . CESAREAN SECTION N/A 11/03/2013   Procedure: CESAREAN SECTION;  Surgeon: Jerelyn Charles, MD;  Location: [facility];  Service: Obstetrics;  Laterality: N/A;   Family History: family history includes Alcohol abuse in her paternal grandfather; Asthma in her brother; Cancer in her maternal grandfather and maternal grandmother; Hypertension in her mother. Social History:  reports that she has never smoked. She has never used smokeless tobacco. She reports that she does not drink alcohol or use drugs.     Maternal Diabetes: No Genetic Screening: Normal Maternal Ultrasounds/Referrals: Normal Fetal Ultrasounds or other Referrals:  None Maternal Substance Abuse:  No Significant Maternal Medications:  None Significant Maternal Lab Results:  Lab values include: Group B Strep negative Other Comments:  None  ROS Maternal Medical History:  Prenatal complications: Anemia  Prenatal Complications - Diabetes: none.      Blood pressure 120/76, pulse 90, temperature 98.9 F (37.2 C), temperature source Oral, resp. rate 20, height 5\' 10"  (1.778 m), weight 64.4 kg (142 lb), unknown if currently breastfeeding. Maternal Exam:  Uterine Assessment: Contraction strength is mild.  Contraction duration is 60 seconds. Contraction frequency is irregular.   Abdomen: Patient  reports no abdominal tenderness. Fundal height is 38 cm.   Estimated fetal weight is 2900 grams.   Fetal presentation: vertex  Introitus: Normal vulva. Normal vagina.  Ferning test: not done.  Nitrazine test: not done. Amniotic fluid character: not assessed.     Fetal Exam Fetal Monitor Review: Baseline rate: 145.  Variability: moderate (6-25 bpm).   Pattern: accelerations present and no decelerations.    Fetal State Assessment: Category I - tracings are normal.     Physical Exam  Nursing note and vitals reviewed.   Prenatal labs: ABO, Rh: --/--/A POS (07/06 1513) Antibody: NEG (07/06 1513) Rubella: Immune (01/05 0000) RPR: Non Reactive (07/06 1513)  HBsAg: Negative (01/05 0000)  HIV: Non-reactive (01/05 0000)  GBS: Negative (06/13 0000)   Assessment/Plan: 31 yo G2P1 at 39 weeks for elective repeat cesarean delivery Risks of procedure explained to patient and she voiced understanding and agrees To proceed. Consent signed, witnessed and placed into chart.  Ancef 2GM IV on call to [place], [place] 09/06/2016, 7:04 AM

## 2016-09-06 NOTE — Anesthesia Procedure Notes (Signed)
Spinal  Patient location during procedure: OB Start time: 09/06/2016 7:16 AM End time: 09/06/2016 7:21 AM Staffing Anesthesiologist: Candida Peeling RAY Performed: anesthesiologist  Preanesthetic Checklist Completed: patient identified, surgical consent, pre-op evaluation, timeout performed, IV checked, risks and benefits discussed and monitors and equipment checked Spinal Block Patient position: sitting Prep: Betadine and site prepped and draped Patient monitoring: heart rate, cardiac monitor, continuous pulse ox and blood pressure Approach: midline Location: L3-4 Injection technique: single-shot Needle Needle type: Pencan  Needle gauge: 24 G Needle length: 10 cm Assessment Sensory level: T4

## 2016-09-06 NOTE — Progress Notes (Signed)
FHR dopplered in OR after Spinal placed. Range from 135-143. Pinn MD notified.  Pearlena Ow [place], [place] 09/06/2016

## 2016-09-06 NOTE — Lactation Note (Signed)
This note was copied from a baby's chart. Lactation Consultation Note  Patient Name: April Lyons Today's Date: 09/06/2016 Reason for consult: Initial assessment  Initial visit at 37 hours of age. Mom reports good feedings and denies pain with latch.  Mom has experience with older child nursing for 10 months. Mom reports baby has been feeding for over 60 minutes alternating breast.  Baby noted to have good latch with wide gape and flanged lips.  Baby sleepy and only sucking intermittently. LC encouraged mom to compress breasts to make feeding more efficient.  Mom is recovering from c/s holding baby in cradle hold. Lc encouraged mom to work with staff to later to work on positioning baby in other holds.   Mom has easily expressed colostrum. LC encouraged mom to hand express before feedings, during feedings and after to apply to nipple.  Mom appreciated reminders. [facility] LC resources given and discussed.  Encouraged to feed with early cues on demand.  Early newborn behavior discussed.   Mom to call for assist as needed.     Maternal Data Has patient been taught Hand Expression?: Yes Does the patient have breastfeeding experience prior to this delivery?: Yes  Feeding Feeding Type: Breast Fed Length of feed: 20 min (20intermittent baby sleepy)  LATCH Score/Interventions Latch: Repeated attempts needed to sustain latch, nipple held in mouth throughout feeding, stimulation needed to elicit sucking reflex. Intervention(s): Assist with latch;Breast massage;Breast compression;Adjust position  Audible Swallowing: A few with stimulation  Type of Nipple: Everted at rest and after stimulation  Comfort (Breast/Nipple): Soft / non-tender     Hold (Positioning): Assistance needed to correctly position infant at breast and maintain latch. Intervention(s): Breastfeeding basics reviewed;Support Pillows;Position options;Skin to skin  LATCH Score: 7  Lactation Tools Discussed/Used     Consult  Status Consult Status: Follow-up Date: 09/07/16 Follow-up type: In-patient    [place], Justine Null 09/06/2016, 10:22 PM

## 2016-09-06 NOTE — Anesthesia Postprocedure Evaluation (Signed)
Anesthesia Post Note  Patient: April Lyons  Procedure(s) Performed: Procedure(s) (LRB): REPEAT CESAREAN SECTION (N/A)     Patient location during evaluation: PACU Anesthesia Type: Spinal Level of consciousness: oriented and awake and alert Pain management: pain level controlled Vital Signs Assessment: post-procedure vital signs reviewed and stable Respiratory status: spontaneous breathing and respiratory function stable Cardiovascular status: blood pressure returned to baseline and stable Postop Assessment: no headache and no backache Anesthetic complications: no    Last Vitals:  Vitals:   09/06/16 0955 09/06/16 1000  BP: 109/66 109/67  Pulse: 66 (!) 58  Resp: 14 10  Temp: (!) 36.3 C     Last Pain:  Vitals:   09/06/16 0955  TempSrc: Oral  PainSc:    Pain Goal:                 Lynda Rainwater

## 2016-09-06 NOTE — Transfer of Care (Signed)
Immediate Anesthesia Transfer of Care Note  Patient: April Lyons  Procedure(s) Performed: Procedure(s) with comments: REPEAT CESAREAN SECTION (N/A) - RNFA  Patient Location: PACU  Anesthesia Type:Spinal  Level of Consciousness: awake, alert  and oriented  Airway & Oxygen Therapy: Patient Spontanous Breathing  Post-op Assessment: Report given to RN and Post -op Vital signs reviewed and stable  Post vital signs: Reviewed and stable  Last Vitals:  Vitals:   09/04/16 1516 09/06/16 0606  BP:  120/76  Pulse:  90  Resp: 16 20  Temp: (!) 36.3 C 37.2 C    Last Pain:  Vitals:   09/06/16 0606  TempSrc: Oral         Complications: No apparent anesthesia complications

## 2016-09-06 NOTE — Anesthesia Preprocedure Evaluation (Signed)
Anesthesia Evaluation  Patient identified by MRN, date of birth, ID band Patient awake    Reviewed: Allergy & Precautions, H&P , Patient's Chart, lab work & pertinent test results  Airway Mallampati: II  TM Distance: >3 FB Neck ROM: full    Dental no notable dental hx.    Pulmonary    Pulmonary exam normal breath sounds clear to auscultation       Cardiovascular Exercise Tolerance: Good  Rhythm:regular Rate:Normal     Neuro/Psych    GI/Hepatic   Endo/Other    Renal/GU      Musculoskeletal   Abdominal   Peds  Hematology   Anesthesia Other Findings   Reproductive/Obstetrics (+) Pregnancy                             Anesthesia Physical  Anesthesia Plan  ASA: II  Anesthesia Plan: Spinal   Post-op Pain Management:    Induction:   PONV Risk Score and Plan:   Airway Management Planned:   Additional Equipment:   Intra-op Plan:   Post-operative Plan:   Informed Consent: I have reviewed the patients History and Physical, chart, labs and discussed the procedure including the risks, benefits and alternatives for the proposed anesthesia with the patient or authorized representative who has indicated his/her understanding and acceptance.   Dental Advisory Given  Plan Discussed with: CRNA  Anesthesia Plan Comments: (Lab work confirmed with CRNA in room. Platelets okay. Discussed spinal anesthetic, and patient consents to the procedure:  included risk of possible headache,backache, failed block, allergic reaction, and nerve injury. This patient was asked if she had any questions or concerns before the procedure started. )        Anesthesia Quick Evaluation

## 2016-09-06 NOTE — Op Note (Signed)
Cesarean Section Procedure Note  Indications: previous uterine incision kerr x 1  Pre-operative Diagnosis: 39 week 0 day pregnancy, prior Low transverse cesarean             Section  Post-operative Diagnosis: same  Surgeon: Caffie Damme   Assistants: None  Anesthesia: Spinal anesthesia  ASA Class: 2  Procedure Details   The patient was counseled about the risks, benefits, complications of the cesarean section. The patient concurred with the proposed plan, giving informed consent.  The site of surgery properly noted/marked. The patient was taken to Operating Room # 9, identified as April Lyons and the procedure verified as C-Section Delivery. A Time Out was held and the above information confirmed.  After spinal was found to adequate , the patient was placed in the dorsal supine position with a leftward tilt, draped and prepped in the usual sterile manner. A Pfannenstiel incision was made and carried down through the subcutaneous tissue to the fascia.  The fascia was incised in the midline and the fascial incision was extended laterally with Mayo scissors. The superior aspect of the fascial incision was grasped with Coker clamps x2, tented up and the rectus muscles dissected off sharply with the scalpel. The rectus was then dissected off with blunt dissection and Mayo scissors inferiorly. The rectus muscles were separated in the midline. The abdominal peritoneum was identified, tented up between two hemostats, entered sharply using Metzenbaum scissors, and the incision was extended superiorly and inferiorly with good visualization of the bladder. Several abdominal adhesions adherent to the uterus were removed using the Bovie. The Alexis retractor was then deployed. The vesicouterine peritoneum was identified, tented up, entered sharply with Metzenbaum scissors, and the bladder flap was created digitally. Scalpel was then used to make a low transverse incision on the uterus which was  extended laterally with  blunt dissection. The fetal vertex was identified, with help from a nurse pushing up on the head vaginally, the head was brought out from the pelvis. A Kiwi Vacuum was placed to aid in delivery of the vertex.  The head was then delivered easily through the uterine incision followed by the body. The A live female infant was bulb suctioned on the operative field cried vigorously, cord was clamped and cut and the infant was passed to the waiting neonatologist. Apgars 8/9 Placenta was then delivered spontaneously, intact and appear normal, the uterus was cleared of all clot and debris. The uterine incision was repaired with #0 Monocryl in running locked fashion. A second imbricating suture was performed using the same suture. The incision was hemostatic. Ovaries and tubes were inspected and normal. The Alexis retractor was removed. The abdominal cavity was cleared of all clot and debris. The abdominal peritoneum was reapproximated with 2-0 chromic  in a running fashion, the rectus muscles was reapproximated with #2 chromic in interrupted fashion. The fascia was closed with 0 PDS in a running fashion starting from each end and meeting in the middle. The subcuticular layer was irrigated and all bleeders cauterized.   The incision was injected with 25 mL of 0.25% Marcaine. Interrupted sutures of 2-0 plain were used to re-approximate Scarpas fascia.  The skin was closed with 4-0 vicryl in a subcuticular fashion using a Lanny Hurst needle. The incision was dressed with benzoine, steri strips and pressure dressing. All sponge lap and needle counts were correct x3. Patient tolerated the procedure well and recovered in stable condition following the procedure.  Instrument, sponge, and needle counts were correct  prior the abdominal closure and at the conclusion of the case.   Findings: Live female infant, Apgars 8/9, clear amniotic fluid, normal appearing placenta, normal uterus, bilateral tubes and  ovaries  Estimated Blood Loss: 700 mL  IVF: 2000 mL         Drains: Foley catheter  Urine output: 200 mL clear urine         Specimens: Placenta to L&D         Implants: none         Complications:  None; patient tolerated the procedure well.         Disposition: PACU - hemodynamically stable.   April Lyons April Lyons

## 2016-09-07 ENCOUNTER — Encounter (HOSPITAL_COMMUNITY): Payer: Self-pay | Admitting: Obstetrics & Gynecology

## 2016-09-07 LAB — CBC
HCT: 27.5 % — ABNORMAL LOW (ref 36.0–46.0)
HEMOGLOBIN: 8.6 g/dL — AB (ref 12.0–15.0)
MCH: 23.5 pg — ABNORMAL LOW (ref 26.0–34.0)
MCHC: 31.3 g/dL (ref 30.0–36.0)
MCV: 75.1 fL — ABNORMAL LOW (ref 78.0–100.0)
Platelets: 222 10*3/uL (ref 150–400)
RBC: 3.66 MIL/uL — AB (ref 3.87–5.11)
RDW: 17.6 % — ABNORMAL HIGH (ref 11.5–15.5)
WBC: 9.4 10*3/uL (ref 4.0–10.5)

## 2016-09-07 LAB — BIRTH TISSUE RECOVERY COLLECTION (PLACENTA DONATION)

## 2016-09-07 NOTE — Lactation Note (Signed)
This note was copied from a baby's chart. Lactation Consultation Note  Patient Name: April Lyons [id number] Date: 09/07/2016 Reason for consult: Follow-up assessment   Follow up with mom of 5 hour old infant. Infant with 13 BF for 10-120 minutes, 3 voids and 8 stools in last 24 hours. LATCH Score 7-8. Infant weight 6 lb 8.8 oz with weight loss of 4% since birth.   Mom reports infant is feeding well. Mom reports infant latches easily and then tends to push off the breast as feeding progresses.  Enc mom to relatch infant as needed to maintain deeper latch. Mom reports latch is not painful but she can tell infant is slipping back some. She reports she is using head and pillow support with feeding. Enc mom to continue to feed infant STS 8-12 x in 24 hours, using both breasts with each feeding. Mom reports she is feeling fuller today.   Discussed with mom that in light of infant bilirubin climbing that I would recommend mom hand express and spoon feed infant after each BF. Mom reports she does not know how to spoon feed. Infant being held by visitors currently, enc mom to call out with next feeding. Mom voiced understanding.    Maternal Data Formula Feeding for Exclusion: No Has patient been taught Hand Expression?: Yes Does the patient have breastfeeding experience prior to this delivery?: Yes  Feeding Feeding Type: Breast Fed  LATCH Score/Interventions Latch: Grasps breast easily, tongue down, lips flanged, rhythmical sucking.  Audible Swallowing: A few with stimulation  Type of Nipple: Everted at rest and after stimulation  Comfort (Breast/Nipple): Soft / non-tender     Hold (Positioning): Assistance needed to correctly position infant at breast and maintain latch.  LATCH Score: 8  Lactation Tools Discussed/Used     Consult Status Consult Status: Follow-up Date: 09/08/16 Follow-up type: In-patient    Debby Freiberg G[place] 09/07/2016, 3:23 PM

## 2016-09-08 NOTE — Progress Notes (Addendum)
Pt showered this evening and when trying to remove the pressure dressing, the honeycomb started to come off with it. RN attempted to call MD on call for dressing change order with no answer. Will try again soon.  No answer 2010, 2025, 2113, 2141.

## 2016-09-08 NOTE — Addendum Note (Signed)
Addendum  created 09/08/16 0704 by Riki Sheer, CRNA   Sign clinical note

## 2016-09-08 NOTE — Lactation Note (Signed)
This note was copied from a baby's chart. Lactation Consultation Note  Patient Name: April Lyons [id number] Date: 09/08/2016 Reason for consult: Follow-up assessment  Baby 64 hours old. Mom states that baby seems to start out with a deep latch and then sometimes slips to the tip of the nipple. Mom able to latch baby in modified cradle position. Assisted mom to turn baby chest-to-chest and support baby's head in a cross-cradle position. Demonstrated how to flange baby's lower lip, and mom reported increased comfort and baby had intermittent swallows in this position.   Maternal Data    Feeding Feeding Type: Breast Fed Length of feed: 25 min  LATCH Score/Interventions Latch: Grasps breast easily, tongue down, lips flanged, rhythmical sucking. Intervention(s): Adjust position;Assist with latch  Audible Swallowing: Spontaneous and intermittent  Type of Nipple: Everted at rest and after stimulation  Comfort (Breast/Nipple): Soft / non-tender     Hold (Positioning): Assistance needed to correctly position infant at breast and maintain latch. Intervention(s): Breastfeeding basics reviewed;Support Pillows;Position options;Skin to skin  LATCH Score: 9  Lactation Tools Discussed/Used     Consult Status Consult Status: Follow-up Date: 09/09/16 Follow-up type: In-patient    Andres Labrum 09/08/2016, 3:07 PM

## 2016-09-08 NOTE — Anesthesia Postprocedure Evaluation (Signed)
Anesthesia Post Note  Patient: Loreley L Eustache  Procedure(s) Performed: Procedure(s) (LRB): REPEAT CESAREAN SECTION (N/A)     Patient location during evaluation: Mother Baby Anesthesia Type: Spinal Level of consciousness: oriented and awake and alert Pain management: pain level controlled Vital Signs Assessment: post-procedure vital signs reviewed and stable Respiratory status: spontaneous breathing and respiratory function stable Cardiovascular status: blood pressure returned to baseline and stable Postop Assessment: no headache and no backache Anesthetic complications: no    Last Vitals:  Vitals:   09/07/16 1719 09/08/16 0526  BP: 123/60 118/64  Pulse: 76 69  Resp: 18 18  Temp: 37.2 C 36.7 C    Last Pain:  Vitals:   09/08/16 0526  TempSrc: Oral  PainSc:    Pain Goal: Patients Stated Pain Goal: 9 (09/07/16 0517)               Riki Sheer

## 2016-09-08 NOTE — Progress Notes (Signed)
POD#1 Pt doing well.  VSSAF IMP/ stable  Plan/ routine care

## 2016-09-09 MED ORDER — OXYCODONE-ACETAMINOPHEN 5-325 MG PO TABS: 2.0000 | ORAL_TABLET | ORAL | 0 refills | Status: AC | PRN

## 2016-09-09 NOTE — Lactation Note (Signed)
This note was copied from a baby's chart. Lactation Consultation Note  Patient Name: April Lyons Date: 09/09/2016 Reason for consult: Follow-up assessment  Baby 20 hours old. Mom reports that she does not need any LC assistance. Mom aware of OP/BFSG and [place] phone line assistance after D/C.   Maternal Data    Feeding    LATCH Score/Interventions                      Lactation Tools Discussed/Used     Consult Status Consult Status: Complete    Andres Labrum 09/09/2016, 9:58 AM

## 2016-09-09 NOTE — Discharge Summary (Signed)
Obstetric Discharge Summary Reason for Admission: cesarean section Prenatal Procedures: ultrasound Intrapartum Procedures: cesarean: low cervical, transverse Postpartum Procedures: none Complications-Operative and Postpartum: none Hemoglobin  Date Value Ref Range Status  09/07/2016 8.6 (L) 12.0 - 15.0 g/dL Final   HCT  Date Value Ref Range Status  09/07/2016 27.5 (L) 36.0 - 46.0 % Final    Physical Exam:  General: alert and cooperative Lochia: appropriate Uterine Fundus: firm Incision: healing well, no significant drainage DVT Evaluation: No evidence of DVT seen on physical exam.  Discharge Diagnoses: Term Pregnancy-delivered  Discharge Information: Date: 09/09/2016 Activity: pelvic rest Diet: routine Medications: PNV, Ibuprofen and Iron Condition: stable Instructions: refer to practice specific booklet Discharge to: home Follow-up Information    Sanjuana Kava, MD Follow up in 2 week(s).   Specialty:  Obstetrics and Gynecology Contact information: [address] [place] [address] [phone]           Newborn Data: Live born female  Birth Weight: 6 lb 13.5 oz (3105 g) APGAR: 8, 9  Home with mother.  Allyn Kenner 09/09/2016, 10:28 AM

## 2016-09-09 NOTE — Plan of Care (Signed)
Problem: Coping: Goal: Ability to cope will improve Outcome: Progressing Pt interacts with infant appropriately

## 2016-09-09 NOTE — Progress Notes (Signed)
MD answered call and orders received to change dressing once. Sterile dressing completed with second person assist Lionel December, Therapist, sports. Dressing intact with no drainage at this time.
